# Patient Record
Sex: Female | Born: 1935 | Race: White | Hispanic: No | Smoking: Never smoker
Health system: Southern US, Community
[De-identification: ages and names within clinical notes are randomized; demographics above are authoritative.]

## PROBLEM LIST (undated history)

## (undated) DIAGNOSIS — F0390 Unspecified dementia without behavioral disturbance: Secondary | ICD-10-CM

## (undated) DIAGNOSIS — I4891 Unspecified atrial fibrillation: Secondary | ICD-10-CM

## (undated) DIAGNOSIS — K219 Gastro-esophageal reflux disease without esophagitis: Secondary | ICD-10-CM

## (undated) DIAGNOSIS — I2510 Atherosclerotic heart disease of native coronary artery without angina pectoris: Secondary | ICD-10-CM

## (undated) DIAGNOSIS — E119 Type 2 diabetes mellitus without complications: Secondary | ICD-10-CM

## (undated) DIAGNOSIS — I509 Heart failure, unspecified: Secondary | ICD-10-CM

## (undated) DIAGNOSIS — I10 Essential (primary) hypertension: Secondary | ICD-10-CM

---

## 1999-06-26 ENCOUNTER — Ambulatory Visit (HOSPITAL_COMMUNITY): Admission: RE | Admit: 1999-06-26 | Discharge: 1999-06-26 | Payer: Self-pay | Admitting: Cardiology

## 1999-10-09 ENCOUNTER — Ambulatory Visit (HOSPITAL_COMMUNITY): Admission: RE | Admit: 1999-10-09 | Discharge: 1999-10-09 | Payer: Self-pay | Admitting: Obstetrics & Gynecology

## 2000-11-19 ENCOUNTER — Other Ambulatory Visit: Admission: RE | Admit: 2000-11-19 | Discharge: 2000-11-19 | Payer: Self-pay | Admitting: Internal Medicine

## 2001-02-24 ENCOUNTER — Encounter: Admission: RE | Admit: 2001-02-24 | Discharge: 2001-02-24 | Payer: Self-pay | Admitting: Internal Medicine

## 2001-02-24 ENCOUNTER — Encounter: Payer: Self-pay | Admitting: Internal Medicine

## 2001-09-18 ENCOUNTER — Emergency Department (HOSPITAL_COMMUNITY): Admission: EM | Admit: 2001-09-18 | Discharge: 2001-09-18 | Payer: Self-pay

## 2001-10-02 ENCOUNTER — Inpatient Hospital Stay (HOSPITAL_COMMUNITY): Admission: EM | Admit: 2001-10-02 | Discharge: 2001-10-13 | Payer: Self-pay | Admitting: Emergency Medicine

## 2001-10-02 ENCOUNTER — Encounter: Payer: Self-pay | Admitting: Emergency Medicine

## 2001-10-04 ENCOUNTER — Encounter (HOSPITAL_BASED_OUTPATIENT_CLINIC_OR_DEPARTMENT_OTHER): Payer: Self-pay | Admitting: Internal Medicine

## 2001-10-13 ENCOUNTER — Inpatient Hospital Stay: Admission: RE | Admit: 2001-10-13 | Discharge: 2001-11-02 | Payer: Self-pay | Admitting: Internal Medicine

## 2001-10-16 ENCOUNTER — Ambulatory Visit (HOSPITAL_COMMUNITY): Admission: RE | Admit: 2001-10-16 | Discharge: 2001-10-16 | Payer: Self-pay | Admitting: Internal Medicine

## 2001-10-16 ENCOUNTER — Encounter (HOSPITAL_BASED_OUTPATIENT_CLINIC_OR_DEPARTMENT_OTHER): Payer: Self-pay | Admitting: Internal Medicine

## 2001-10-20 ENCOUNTER — Encounter (HOSPITAL_BASED_OUTPATIENT_CLINIC_OR_DEPARTMENT_OTHER): Payer: Self-pay | Admitting: Internal Medicine

## 2001-10-28 ENCOUNTER — Encounter (HOSPITAL_BASED_OUTPATIENT_CLINIC_OR_DEPARTMENT_OTHER): Payer: Self-pay | Admitting: Internal Medicine

## 2002-01-04 ENCOUNTER — Encounter (INDEPENDENT_AMBULATORY_CARE_PROVIDER_SITE_OTHER): Payer: Self-pay | Admitting: Specialist

## 2002-01-04 ENCOUNTER — Ambulatory Visit (HOSPITAL_COMMUNITY): Admission: RE | Admit: 2002-01-04 | Discharge: 2002-01-05 | Payer: Self-pay | Admitting: General Surgery

## 2003-08-28 ENCOUNTER — Inpatient Hospital Stay (HOSPITAL_COMMUNITY): Admission: AD | Admit: 2003-08-28 | Discharge: 2003-09-12 | Payer: Self-pay | Admitting: Emergency Medicine

## 2003-08-28 ENCOUNTER — Encounter: Payer: Self-pay | Admitting: Emergency Medicine

## 2003-08-31 ENCOUNTER — Encounter: Payer: Self-pay | Admitting: Internal Medicine

## 2003-09-04 ENCOUNTER — Encounter: Payer: Self-pay | Admitting: *Deleted

## 2003-09-05 ENCOUNTER — Encounter: Payer: Self-pay | Admitting: Internal Medicine

## 2003-09-06 ENCOUNTER — Encounter (INDEPENDENT_AMBULATORY_CARE_PROVIDER_SITE_OTHER): Payer: Self-pay | Admitting: Cardiology

## 2003-09-12 ENCOUNTER — Inpatient Hospital Stay: Admission: RE | Admit: 2003-09-12 | Discharge: 2003-09-23 | Payer: Self-pay | Admitting: Internal Medicine

## 2004-07-25 ENCOUNTER — Emergency Department (HOSPITAL_COMMUNITY): Admission: EM | Admit: 2004-07-25 | Discharge: 2004-07-25 | Payer: Self-pay | Admitting: Emergency Medicine

## 2006-04-03 ENCOUNTER — Inpatient Hospital Stay (HOSPITAL_COMMUNITY): Admission: EM | Admit: 2006-04-03 | Discharge: 2006-04-06 | Payer: Self-pay | Admitting: Emergency Medicine

## 2006-04-04 ENCOUNTER — Encounter: Payer: Self-pay | Admitting: Vascular Surgery

## 2006-12-18 ENCOUNTER — Ambulatory Visit: Payer: Self-pay | Admitting: Internal Medicine

## 2006-12-23 ENCOUNTER — Ambulatory Visit: Payer: Self-pay | Admitting: Cardiology

## 2010-12-07 ENCOUNTER — Encounter
Admission: RE | Admit: 2010-12-07 | Discharge: 2010-12-07 | Payer: Self-pay | Source: Home / Self Care | Attending: Nephrology | Admitting: Nephrology

## 2011-01-06 ENCOUNTER — Encounter: Payer: Self-pay | Admitting: Family Medicine

## 2011-05-03 NOTE — Assessment & Plan Note (Signed)
HEALTHCARE                         GASTROENTEROLOGY OFFICE NOTE   CASS, EDINGER                       MRN:          [id number]  DATE:12/18/2006                            DOB:          1922-04-01    PRIMARY CARE PHYSICIAN:  Maxwell Caul, M.D.   REASON FOR CONSULTATION:  Abdominal pain.   ASSESSMENT:  An 77 year old white women has had a 34-month history of  periumbilical left-sided abdominal pain.  It is poorly characterized.  It may be helped somewhat by some Bentyl.  It is not affected by meals  or position and there is no change in bowel habits or other upper GI  symptomatology.  She is tender in the left upper and left lower  quadrants, without rebound or guarding.  I am only able to examine her  in the sitting position as she is unable to get onto the exam table, due  to lower extremity weakness.   PLAN:  1. BMET, TSH (she is gaining weight and has peripheral edema).  2. CT of abdomen and pelvis with IV and oral contrast.  3. Phone call to the patient after these results are in, further plans      pending that.  Depending upon her clinical course if the CT is      unrevealing and she feels better we might just monitor things.   HISTORY:  An 77 year old white women with problems as described above.  For the past 2 months she has had this problem.  She does not have  chronic abdominal complaints.  She says she is actually gaining some  weight, though she is eating less.  She feels like abdominal growth may  be a bit more, I think.  Denies other GI symptoms, she moves her bowels  twice a day, this is normal.  There is no description of bleeding,  dysphagia, heartburn, indigestion or regurgitation or nausea and  vomiting.   MEDICATIONS:  1. AcipHex 20 mg daily.  2. Aspirin 325 mg daily.  3. Diovan 80 mg daily.  4. Flonase daily.  5. Lasix 80 mg daily.  6. Glucotrol 5 mg daily.  7. Metoprolol 25 mg daily.  8. Tylenol 500 mg  daily.  9. Potassium chloride 20 mg twice daily.  10.Desyrel 50 mg b.i.d.  11.Ambien CR 12.5 mg at bed time.  12.Dermatop ointment p.r.n.  13.Nizoral 2% creme p.r.n.  14.Sina p.r.n.  15.Darvocet p.r.n.   Drug allergies, none known.   She is also on Bentyl 20 mg t.i.d.   PAST MEDICAL HISTORY:  Gated stability, hyperparathyroidism, mild  anemia, lower sacral fracture, depression, questionable dementia,  history of urinary tract infection, cholecystectomy, hiatal hernia  repair, hysterectomy, appendectomy, allergies, sleep apnea,  hypertension, obesity and over active bladder.   FAMILY HISTORY:  Non contributory.   SOCIAL HISTORY:  She is widowed, she is living at [employer].  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  She has 2 sons.  No alcohol, tobacco  or drugs.   REVIEW OF SYSTEMS:  She uses a walker due to gate instability and lower  extremity weakness.  She has hearing difficulty.  She has increasing  lower extremity edema which is a chronic recurrent process.  She has  chronic back pain.   PHYSICAL EXAMINATION:  GENERAL:  Obese, pleasant overweight elderly  women.  VITAL SIGNS:  Height 52, weight 210 pounds blood pressure 92/64, pulse  58.  She is unable to get onto the exam table due lower extremity  weakness.  HEENT:  She has decreased hearing and a loud voice is heard by her.  Eyes:  Anicteric.  ENT:  Normal nose, mouth.  NECK:  Supple no thyromegaly or mass.  CHEST:  Clear.  HEART:  S1, S2, I hear no rubs, murmurs or gallops.  ABDOMEN:  Obese, tender in left lower quadrant, left upper quadrant area  examined in a sitting position and standing.  I do not see any obvious  hernia.  She has minimal guarding I suppose, in that area without any  rebound.  EXTREMITIES:  She has 2+ lower extremity edema bilaterally.  LYMPHATIC:  No neck or supraclavicular nodes.  PSYCH:  She is alert and orientated x3.  SKIN:  Is warm and dry and aged.   I have reviewed office  notes sent by Dr. Hyacinth Meeker.   I appreciate the opportunity to care for this patient.     Iva Boop, MD,FACG  Electronically Signed    CEG/MedQ  DD: 12/18/2006  DT: 12/18/2006  Job #: [id number]   cc:   Bertram Millard. Hyacinth Meeker, M.D.

## 2011-05-03 NOTE — Discharge Summary (Signed)
[place]  Patient:    Isabella Mccoy, ARRAMBIDE Visit Number: [id number] MRN: [id number]          Service Type: ECR Location: SACU 4502 01 Attending Physician:  Terald Sleeper Dictated by:   Lorin Picket. Claiborne Billings, N.P. Admit Date:  10/13/2001 Disc. Date: 11/02/01   CC:         Mark C. Vernie Ammons, M.D. of urology  Ollen Gross. Vernell Morgans, M.D. of surgery  Alfonse Alpers. Dagoberto Ligas, M.D. of endocrinology   Discharge Summary  DATE OF BIRTH:  05-Feb-1922  CHIEF COMPLAINT:  Deconditioning requiring physical and occupational therapy.  HISTORY OF PRESENT ILLNESS:  This elderly female patient of Dr. Gaspar Skeeters was followed at [facility] office.  She was actually admitted to hospital October 02, 2001 for fall, spending a great deal of time on the floor and unable to get back up.  Noted decline in her renal function and elevated serum CK - both thought to be due to her prolonged time down post fall.  Over the course of hospitalization she was noted to have urinary retention, stabilized with a Foley catheter.  She was noted to have an elevated serum calcium and an elevated parathyroid function.  Etiology of this is unclear. She was acutely delirious but improved over the course of initial hospitalization.  It is felt that the patient has some baseline dementia.  A mini mental exam was administered and the patient scored 15/30.  She was noted to have a urinary tract infection and this resolved with antibiotic therapy. On October 14, 2001 patient was discharged from her regular medical bed to the [facility].  It was felt that her gait was not sufficiently stable to allow her to return home.  MEDICAL HISTORY:  1. Left mild CVA.  2. Hypertension.  3. Urinary frequency.  4. Hiatal hernia.  5. Osteoarthritis.  6. Urinary retention, stabilized with Foley catheter.  7. Hypercalcemia and hyperparathyroidism - etiology unclear.  8. Acute  delirium.  9. Leukocytosis - etiology unclear. 10. Urinary tract infection, resolved with antibiotic therapy. 11. Weakness and gait instability requiring physical and occupational therapy     at [facility] October 2002.  MEDICATIONS AT TIME OF ADMISSION:  See admission History and Physical Exam.  SOCIAL HISTORY:  Patient was living alone.  She has two sons who are joint power-of-attorney.  REVIEW OF SYSTEMS:  HEENT:  Denied headache.  MOTOR:  Denies focal weakness but does complain of numbness off and on in the left hand.  Family has noted a gait decline over several months.  Patient has a walker but was not utilizing it at home.  CARDIOVASCULAR:  Denied chest pain or palpitation.  LUNGS: Denies shortness of breath or cough.  ABDOMEN:  Denies nausea and vomiting. GENITOURINARY:  Denies any symptoms of UTI.  There is a history of recent urinary retention, stabilized with Foley catheter.  PHYSICAL EXAMINATION:  CURRENT VITAL SIGNS:  Temperature 98.2, pulse 82, respirations 20, blood pressure 191/86.  O2 saturation 95% on room air.  GENERAL:  Alert and responsive elderly female in no acute distress.  HEENT:  Fundi revealed grade 2 hypertensive changes.  Ear canals clear and hearing grossly normal.  Nose reveals nares patent without masses or discharge noted.  Oral mucosa pink and moist.  LUNGS:  Breath sounds are clear and equal without distress or cough.  CARDIOVASCULAR:  Heart sounds normal with rate and rhythm regular.  No murmur, S3, S4 noted.  BREAST:  Deferred as noncontributory.  ABDOMEN:  Soft and positive bowel sounds in all four quadrants.  No pain or masses with palpation.  No organomegaly noted.  EXTREMITIES:  No edema seen.  There is osteoarthritis and crepitus at the knee joints bilaterally.  GENITOURINARY:  No bladder pain or CVA tenderness.  Foley catheter in place draining clear yellow urine.  NEUROLOGIC:  Cranial nerves 2-12 grossly intact.   Extraocular movements appear intact.  No increase in tone.  No cogwheeling noted.  There is decreased strength bilaterally in the upper extremities, estimated 4/5.  There is left pronator drift.  Reflexes found Babinski responses extensor bilaterally. Reflexes, deep tendon, 3+ at the knees bilaterally.  Left brachioradialis and left biceps reflexes are brisker than on the right.  Positive left Hoffmanns reflex.  HOSPITAL COURSE:  Again, patient was admitted to the rehab unit October 14, 2001 for strengthening and gait training.  Mini mental exam was administered with the patient scoring 15/30.  Clark test was administered and the patient was unable to localize a time.  Serum calcium levels noted elevated throughout the initial portion of hospitalization.  Also noted elevated serum calcium at 11.8 at admission to the rehabilitation unit.  Patient was started on Miacalcin, thinking that perhaps the elevated serum calcium was responsible for her mental status changes.  PTH intact noted elevated at 99.  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  Serum calcium, with the use of Miacalcin, was reduced to nearly normal levels of 10.8 by October 31, 2001.  This made no obvious change in the patients mental function, with repeat mini mental examination score still 15/30.  There was mild hypokalemia noted but this resolved with replacement.  A consult was obtained with Dr. Corrin Parker. He came October 20, 2001.  Felt the ultimate question was whether the patient should have a parathyroid surgery or not.  Felt it was reasonable to lower calcium levels and follow the patients mentation.  Felt that irregardless of the effectiveness in lowering patients serum calcium in regards to her mentation that the hyperparathyroidism would be progressive over a chronic  period of time and therefore ultimately may need surgery  irrespective of her mental status.  A consult obtained with Dr. Chevis Pretty on October 26, 2001. Patient had a sestamibi scan that appeared to localize possible parathyroid adenoma.  Did not feel that elevated calcium levels, at the degree seen over hospitalization, were a major factor in current mental status.  Felt that the patient may be amenable to a minimally invasive parathyroidectomy.  Felt that this surgery could be arranged on an elective basis.  Per therapy, following the patients progress since her stay here in the rehabilitation unit, it was felt that the patient would require discharge to an SNF facility for continued physical and occupational therapy.  The plan is to transfer the patient to [facility] for therapy in that setting.  Calcitonin was discontinued and will require a follow-up calcium post discharge.  Will need a follow-up with Dr. Vernie Ammons of urology to reevaluate urinary retention and the need for the Foley catheter.  Will need follow-up with Dr. Carolynne Edouard of surgery in one to two weeks post discharge to reevaluated possible parathyroid surgery.  At discharge the patient was requiring minimal assistance to ambulate approximately 30 feet.  Will need nutritional follow-up post discharge to reevaluate patients oral intake and to follow  weights and intakes for any indication of failure-to-thrive.  At discharge the patient is medically stable though frail, with multiple medical problems as above.  It is hoped at some point we will be able to discontinue the Foley catheter allowing the patient to transfer to an assisted living level of care once she is stable from a physical and occupational therapy standpoint.  DISCHARGE LABORATORY DATA:  Serum albumin done November 02, 2001 was low at 3.2.  CBC done November 02, 2001 found WBC 9.7, RBC low 3.71, hemoglobin low 12.1, hematocrit low 34.7, platelets 250.  Metabolic panel November 02, 2001 found sodium 138,  potassium 4.2, chloride 111, CO2 21, glucose high 156, BUN 18, creatinine 1.1.  Calcium was high at 11.0.  Stool for occult blood was negative.  PTT 28 and PT 14.2 with INR 1.1 done October 27, 2001.  Urine culture done October 22, 2001 was negative.  Hepatic function panel done October 21, 2001 was essentially normal.  Serum phosphorus was low at 1.7.  Ultrasound of the thyroid noted an unremarkable thyroid ultrasound, other than a small midline nodule within the isthmus, done October 16, 2001.  Abdominal x-ray noted no significant obstruction or perforation of the bowel. Minimal increase in large and small bowel gas.  Vague 1.5 x 2 cm density right upper abdomen, probably due to loops of bowel, but the possibility of minimally calcified gallstones could not be completely ruled out.  A parathyroid scan done October 21, 2001 indicated abnormal focus of increased activity within the left neck consistent with parathyroid adenoma.  X-rays of the sacrum and coccyx noted contour abnormality in the lower sacrum. Unclear if this represents an acute or chronic fracture.  Correlate with point of tenderness.  DISCHARGE MEDICATIONS:  1. Plavix 75 mg once daily.  2. Inderal 20 mg twice daily.  3. Norvasc 5 mg daily.  4. Avapro 300 mg daily.  5. Multivitamin daily.  6. Senokot S one tablet daily at bedtime.  7. Protonix 40 mg once daily.  8. Zoloft 25 mg daily.  9. Dulcolax 10 mg suppository once daily at bedtime as needed for     constipation. 10. Tylenol 650 mg q.4h. as needed for mild pain or fever. 11. Ensure chocolate supplement three times daily between meals at 10 a.m.,     2 p.m., and bedtime.  DISCHARGE DIAGNOSES:  1. Gait instability requiring ongoing physical and occupational therapy.  2. Hyperparathyroidism, probable adenoma per scan.  Follow up post discharge     - Dr. Carolynne Edouard.  3. Mild anemia, stable.  4. Urinary retention, discharged with Foley catheter.  Follow up with      Dr. Vernie Ammons of urology one to two weeks post discharge.  5. Hypercalcemia secondary to hyperparathyroidism.  6. Urinary tract infection, resolved.  7. Question lower sacral fracture, chronic versus acute.  8. Hypokalemia, resolved.  9. Depression, stable. 10. Probable moderate dementia per baseline.  CONSULTS OVER THE COURSE OF HOSPITALIZATION:  1. Dr. Ollen Gross. Toth III of surgery.  2. Dr. Alfonse Alpers. Gegick of endocrinology.  3. Dr. Loraine Leriche C. Ottelin of urology.  PROCEDURES OVER THE COURSE OF HOSPITALIZATION:  1. Parathyroid scan, October 20, 2001 - as above.  2. Ultrasound of the thyroid/neck, October 16, 2001 - results as above.  3. X-ray of the sacrum and coccyx with results as above, October 28, 2001.  SPECIAL DISCHARGE INSTRUCTIONS:  1. Patient will require a follow up urology appointment with Dr. Vernie Ammons.     Please  call his office for appointment and arrange transportation.     Question is can Foley catheter be discontinued.  2. Patient will need a follow up with Dr. Carolynne Edouard of surgery to review     possibility of parathyroid surgery, one to two weeks post discharge.     Please call his office to arrange appointment and transportation.  3. Patient will need a follow-up serum calcium on November 05, 2001.  4. Foley catheter to straight drain.  Maintain per facility protocol.  5. Physical and occupational therapy evaluate and treat as appropriate.  6. Should have follow-up CBC and basic metabolic panel approximately two     weeks post discharge.  Metabolic panel should include serum calcium.  7. Discharge diet is regular.  CONDITION AT TIME OF DISCHARGE:  Stable/improved, though medically frail with multiple medical problems as above.  Cannot disclude the need for further hospitalizations.  Discharge process greater than 30 minutes - 12:15 p.m. through 1:10 p.m. Dictated by:   Lorin Picket. Claiborne Billings, N.P. Attending Physician:  Terald Sleeper DD:  11/02/01 TD:   11/02/01 Job: [id number]

## 2011-05-03 NOTE — Discharge Summary (Signed)
NAMEMarland Mccoy  LICHELLE, VIETS NO.:  [id number]   MEDICAL RECORD NO.:  [id number]          PATIENT TYPE:  INP   LOCATION:  4740                         FACILITY:  [facility]   PHYSICIAN:  Lonia Blood, M.D.      DATE OF BIRTH:  Oct 15, 1922   DATE OF ADMISSION:  04/03/2006  DATE OF DISCHARGE:  04/06/2006                                 DISCHARGE SUMMARY   PRIMARY CARE PHYSICIAN:  Maxwell Caul, M.D., [facility].   DISCHARGE DIAGNOSES:  1.  Acute shortness of breath.  2.  Hypoxemia.  3.  Lower extremity edema, now resolved.  4.  Acute on chronic renal failure.  5.  Paroxysmal atrial fibrillation.  6.  Diabetes type 2.  7.  Obesity.  8.  History of cerebrovascular accident.  9.  Hiatal hernia.   DISCHARGE MEDICATIONS:  1.  Aspirin 325 mg daily.  2.  Aciphex 20 mg daily.  3.  Senokot S one tablet q.h.s.  4.  Multivitamin one tablet daily.  5.  Lasix 80 mg daily.  6.  Nasonex nasal inhaler one each nostril daily.  7.  Diovan 80 mg daily.  8.  Toprol XL 25 mg daily.  9.  Ciprofloxacin 1250 mg b.i.d. for three more days.  10. Glipizide 5 mg daily.  11. Trazodone 50 mg p.o. b.i.d.  12. Potassium 20 mEq b.i.d.  13. The patient's Norvasc is currently on hold and may be resumed by her      primary care physician as an outpatient.   DISPOSITION:  The patient is currently stable, no shortness of breath.  She  insists on leaving the hospital and also refused to have a V/Q scan done to  further assess the initial cause of her shortness of breath.  I do not  believe strongly that she has a PE.  As such, I did not press her further.  Besides, she is not a good candidate for anticoagulation secondary to  multiple falls.   PROCEDURES PERFORMED:  1.  Chest x-ray performed on April 03, 2006, a two-view x-ray, shows      cardiomegaly with no failure, with right basilar atelectasis.  2.  Another chest x-ray also on the same day showed cardiomegaly with      vascular  congestion but no edema.   CONSULTATIONS:  None.   BRIEF HISTORY AND PHYSICAL:  Please refer to dictated history and physical  by Lonia Blood, M.D.  In short, however, the patient is an 77 year old female  that presented with shortness of breath.  She had a history of CHF by  diastolic dysfunction, depression, diabetes and hypertension.  She also had  some bilateral lower extremity edema at the time of admission.  She has  baseline history of chronic atrial fibrillation, which is paroxysmal in  nature.  While in the ED, the patient was found to be somewhat hypotensive  despite her findings.  Her blood pressure at the time was as low as 90;  however, it rose to 110/60.  The patient was subsequently admitted for rule-  out MI as  well as PE.   HOSPITAL COURSE:  Problem 1.  SHORTNESS OF BREATH:  The patient was admitted.  The dose of her  diuretics was slightly decreased, placed on oxygen.  While in the hospital  she had serial cardiac enzymes performed x3 that showed no evidence of acute  MI.  Also had lower extremity Dopplers performed to rule out DVT, which was  negative.  A V/Q scan was also ordered due to her low serum creatinine, but  the patient refused to allow a V/Q scan to be performed.  Her clinical  course improved, and she has been back to her baseline for two days prior to  today.   Problem 2.  ACUTE RENAL FAILURE:  The patient seemed to have been at her  baseline with creatinine ranging between 1.3 and 1.4.  We maintained her on  her current diuretic regimen after reducing the Lasix dose to 80 mg a day.   Problem 3.  ATRIAL FIBRILLATION:  The patient has been in and out of atrial  fibrillation, but her rate has been controlled for the most part.   Problem 4.  LOWER EXTREMITY EDEMA:  That has gradually disappeared, probably  due to the increase in her Lasix dose.   Problem 5.  DIABETES:  Her blood sugar has been fine here in the hospital.  As such, no further adjustments  were made to her medications.   Problem 6.  OBESITY:  The patient has been counseled accordingly.   Problem 7.  CONSTIPATION:  The patient has had four bowel movements since  admission.  We have given her some laxatives that  have not been as useful.  I am giving her magnesium citrate today and once she has had a good bowel  movement, she will be ready to go home.   DISCHARGE LABORATORY DATA:  Labs include white count 10.3, hemoglobin 13,  platelets 190.  Sodium is 144, potassium 3.5, chloride 109, CO2 26, glucose  107, BUN 20, creatinine 1.4.      Lonia Blood, M.D.  Electronically Signed     LG/MEDQ  D:  04/06/2006  T:  04/08/2006  Job:  [id number]

## 2011-05-03 NOTE — Discharge Summary (Signed)
[place]. [facility]  Patient:    Isabella Mccoy, Isabella Mccoy Visit Number: [id number] MRN: [id number]          Service Type: ECR Location: SACU 4502 01 Attending Physician:  Terald Sleeper Dictated by:   Lorin Picket. Claiborne Billings, N.P. Admit Date:  10/13/2001 Disc. Date: 10/14/01   CC:         Terald Sleeper, M.D.  Veverly Fells. Vernie Ammons, M.D.  Kelli Hope, M.D.   Discharge Summary  DATE OF BIRTH:  1922-01-05  CHIEF COMPLAINT:  Fall, spending a long time on the floor and unable to get back up.  HISTORY OF PRESENT ILLNESS:  This 77 year old patient is followed by Dr. Murray Hodgkins at [facility].  She was admitted through the emergency room after falling at home.  She was unable to get up after falling. A clear history of this fall was unable to be obtained.  She states that she was on her way to the telephone.  Apparently, she had three falls over the past few months.  Cause of these were uncertain.  There was no clear history of chest pain, palpation, loss of consciousness, focal or neurologic signs. Son states that her gait has deteriorated over the past month.  She has a walker, but really does not use this.  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  She has, however, made a good functional recovery but complains of numbness in the left hand currently.  She was also in the ER with left hand numbness some weeks ago, but apparently, the CT scan done at that time was negative.  PAST MEDICAL HISTORY:  Though limited as old records were unavailable, but includes: 1. Left mild CVA. 2. Hypertension. 3. Urinary frequency. 4. Hiatal hernia. 5. Osteoarthritis.  MEDICATIONS ON ADMISSION: 1. Diovan/HCTZ 80/12.5 mg once daily. 2. Celebrex 200 mg daily. 3. Plavix 75 mg daily. 4. Metoclopramide 10 mg daily at bedtime. 5. Sonata. 6. Propranolol 20 mg twice daily. 7. Detrol 2 mg twice daily. 8. Albuterol and  Serevent metered dose inhalers as needed.  SOCIAL HISTORY:  The patient lives alone.  Two sons who have joint power of attorney.  One of the sons was with his mother at the time of admission.  The family had already initiated inquiries regarding assisted living.  REVIEW OF SYSTEMS:  HEENT:  Denies headache.  MOTOR:  Denies focal weakness. Does complain of numbness off and on in the left hand.  The family has noted a gait decline over the past several months.  Apparently, the patient owns a walker but does not use it.  CARDIOVASCULAR:  Denies chest pain or palpation. ABDOMEN:  Denies nausea or vomiting.  URINARY/GENITAL:  Denies symptomology to suggest UTI.  PHYSICAL EXAMINATION:  VITAL SIGNS:  Only listed blood pressure per admission history and physical exam, 178/50.  GENERAL:  Alert and responsive.  Elderly female in no acute distress.  The patient was utilizing a warming blanket in the emergency room.  HEENT:  Fundi revealed grade 2 hypertensive changes.  Ears: Canals clear. Hearing grossly normal.  Nose:  There is pain without masses or discharge noted.  Oral mucosa is pink and moist.  RESPIRATORY:  Lungs clear and equal without stress or cough.  CARDIOVASCULAR:  Heart sounds normal, no murmur.  S3, S4, no bruits noted.  BREAST:  Deferred.  ABDOMEN:  Soft and positive bowel sounds in all four quadrants.  No  pain or masses with palpation.  No organomegaly noted.  EXTREMITIES:  No edema seen.  There is osteoarthritis and crepitus in the knees bilaterally.  URINARY/GENITAL:  No bladder pain.  NEUROLOGIC:  Cranial nerves II-XII grossly intact.  Extraocular movements appear intact.  No increased tone or cogwheeling noted.  There is decreased strength bilaterally in the upper extremities 4/5.  There is left pronator drift.  Reflexes found Babinski responses extensory bilaterally.  Reflexes 3+ and brisk at the knees.  Left brachial radialis and left bicep reflexes are brisker  than the right.  Positive left Hoffmans reflex.  HOSPITAL COURSE:  The patient was admitted for further evaluation and stabilization.  Admission impressions of gait ataxia and falls.  Parkinsonism either idiopathic disease, vascular parkinsonism, or Reglan-induced parkinsonism.  Possible urinary tract infection and hypertension.  Cardiac enzymes noted (see labs below).  Doubt an acute MI, but rather rapid myelosis secondary to fall with prolonged time down.  Did note mild ST changes per the EKG, thus patient was placed on telemetry.  Admission laboratory data ______ 21.0, total ______ 22.0.  CBC October 02, 2001 ______ 39.  Differential done October 02, 2001 was essentially unremarkable.  Metabolic panel October 02, 2001 found sodium 144, potassium low 2.9, chloride 110, CO2 24, BUN 16 and creatinine 1.1.  Calcium noted elevated 11.9.  AST elevated 38 with other liver function tests unremarkable.  Initial cardiac enzymes, CK elevated 434, MB high 11.4, relative index 2.6 and troponin elevated to 0.10.  Followup enzymes CK high 302, MB high 8.0.  Relative index high 2.6 and troponin elevated 0.08.  Followup enzymes, CK high 205, MB high 6.5, relative index high 3.2 and troponin 0.06.  Final enzymes, October 05, 2001, CK of 142, MB 2.9, relative index 2.0 and troponin 0.02.  TSH normal 4.621.  Urinalysis suspicious for UTI with positive nitrite, 2150 wbc, 0-2 RBC and many bacteria. Urine culture obtained at the time of admission.  A CT scan of the brain was done October 02, 2001.  This indicated no evidence of acute intracranial process.  No change since recent prior CT scan.  Patient was initiated on her previous medications.  Also started empirically on Tequin antibiotic therapy for suspected urinary tract infection.  Diet, regular with no added salt.  IV  hydration with D5 normal saline and potassium supplementation 125 cc per hour. ______  Renal function remained ______ colony counts  ______ therapy. ______ and antibiotic therapy was discontinued and a course of Septra DS started twice daily for a total of 10 days.  ______ was noted quite confused over the course of hospitalization, unclear of exact patient baseline here, however, suspected an acute delirium.  An MRI/MRA of the brain was done October 04, 2001 to further follow.  MRI portion noted chronic ischemic changes with no acute abnormality. MRA portion was suboptimal study with no definite intracranial occlusion.  ______ problems per telemetry and patient was moved from telemetry unit.  Was discontinued for parkinsonism at admission.  Symptoms have gradually improved over the course of hospitalization and parkinsonism felt likely secondary to the Reglan.  The patient developed what appeared to be urinary retention.  A Foley catheter was placed for management.  A urology consult was obtained with Dr. Vernie Ammons October 08, 2001.  Noted patient was currently on Urecholine. Felt this could be increasing sense of urgency and should be stopped.  Also noted patient on Detrol, which would decrease urgency and possibly cause postvoid residual and also felt  this should be stopped.  Per a lengthy discussion with the patients family, it was felt best to manage the patients problem short-term with a Foley catheter placement.  Felt the patient should follow up with him in about four weeks post discharge.  Also seen in consult by Dr. Kelli Hope of neurology regarding gait disorder and falls.  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  parkinsonian component probably vascular in nature.  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  Did agree with discontinuation of Reglan.  If this did not result in significant improvement, would consider trial of Sinemet 25/100 mg tablets, 1/2 tablet 3 times daily with  meals.  Also, strongly consider antidepressant, possibly in combination with stimulant Ritalin.  Did suggest the MRI, which was reported as above.  The patients mental status did improve somewhat over the course of hospitalization, however, admitting mental exam was administered and the patient scored 15/30.  Again, I am unsure of exact patient baseline, but suspect an underlying dementia here as well.  Weight currently is 180 with ideal body weight of 110 plus or minus 10%.  Thus patient 148 of ideal body weight.  Nutritional needs estimated 1600 to 1700 kcal and 80-90 g of protein daily.  Suggested liberalization of diet from regular no-added salt, to regular in an attempt to improve oral intake which was done.  The patient did have some low-grade leukocytosis, unclear of the etiology here and this was not aggressively investigated on the course of hospitalization.  This will need to be followed up as an outpatient.  Finally, the patient had some elevated serum calciums.  A serum PTH was done October 06, 2001 with PTH intact, level elevated 141.  Calcium for PTH noted 10.3 at that time.  SED rate was also checked and this was normal at 8.  It is felt that some type of parathyroid etiology is responsible for hyperkalemia.  This was simply monitored over the course of hospitalization at this point.  Ethical discussions were conducted with the patients family regarding placement.  It was not felt that the patient would be able to return to independent living. Ultimately, a decision was made to seek assisted living placement that would accommodate a Foley catheter.  It was felt that the patients gait was currently too unstable for even assisted living level, thus placement was sought at subacute care unit.  Will need follow up blood work to monitor serum calcium.  Will need close supervision of oral intake to assure adequacy by nutritionalist.  Will need ongoing physical and occupational  therapy with ongoing goal to return to assisted living level.  Antibiotic therapy will need to be completed for noted urinary tract infection.  At discharge, patient stable/improved, thus felt medically fairly mobile medical problems as above.  DISCHARGE LABORATORY DATA:  Comprehensive metabolic panel done October 11, 2001 found sodium 140, potassium low 3.3, chloride 114, CO2 19, glucose high 182, BUN 11, creatinine 1.3.  Calcium was high at 1.9.  Albumin was low at 3.3.  Liver indices unremarkable.  Follow-up blood gas October 11, 2001 on room air, pH high 7.449, pCO2 low 29.1, pO2 98.9, bicarb 22.4 and total CO2 20.8.  CBC October 11, 2001 found WBC elevated, though improved 1.9, hemoglobin 13.8, hematocrit 39.5, platelets 245.  Sed rate done October 04, 2001 normal at 8.  Cardiac enzymes as indicated above.  Serum TSH normal 4.621.  Serum  PTH elevated 141.  Urine culture greater than 100,000 colony, staph coagulase negative, sensitive to current antibiotic therapy with Septra DS.  Radiology as reported above.  DISCHARGE MEDICATIONS:  1. Plavix 75 mg once daily.  2. Propranolol 20 mg twice daily.  3. Norvasc 5 mg once daily.  4. Avapro 300 mg daily.  5. Septra DS one tablet twice daily through October 19, 2001 and then     discontinue.  6. Multivitamin once daily.  7. Albuterol metered dose inhaler 2 puffs every 4 hours as needed.  8. Tylenol 650 mg every 4 hours as needed for mild pain or fever.  9. Laxative of choice once daily. 10. Haldol 1 mg every 6 hours as needed for severe agitation.  DISCHARGE DIAGNOSES:  1. Urinary retention, stabilized with Foley catheter.  Followup urology in     approximately 4 weeks.  2. Hypercalcemia and hyperparathyroidism, exact etiology unclear at this     point.  3. Acute delirium, resolved/improved but note baseline dementia likely.  4. Leukocytosis unclear etiology.  Seems to self improving.  5. Weakness and gait instability requiring  physical and occupational therapy     rehabilitation postdischarge.  6. Urinary tract infection, improving on current antibiotic therapy.   CONSULTS OVER THE COURSE OF HOSPITALIZATION:  1. Dr. Vernie Ammons, neurology.  2. Dr. Thad Ranger, neurology.  PROCEDURES OVER THE COURSE OF HOSPITALIZATION:  1. MRI of the brain, October 04, 2001, results as above.  2. CT scan of the brain, October 02, 2001, results as above.  ADDITIONAL DISCHARGE INSTRUCTIONS:  1. Will need a followup with Dr. Vernie Ammons in approximately four weeks     postdischarge.  2. Review urinary retention and Foley catheter use.  3. Diet regular at discharge.  4. Will need followup of serum calcium postdischarge.  5. Will need nutritional consult to insure adequacy of diet and fluid intake     postdischarge.  6. Will need mental function review with repeat mini mental status exam at     some point in the near future as well.  CONDITION AT THE TIME OF DISCHARGE:  Stable/improved, though medically fair with multiple medical problems as above.  DISCHARGE PROCESS:  Greater than 30 minutes, 10:30 a.m. through 11:15 a.m. Dictated by:   Lorin Picket. Claiborne Billings, N.P. Attending Physician:  Terald Sleeper DD:  10/15/01 TD:  10/15/01 Job: [id number]

## 2011-05-03 NOTE — Consult Note (Signed)
[place]. [facility]  Patient:    Isabella Mccoy, Isabella Mccoy Visit Number: [id number] MRN: [id number]          Service Type: ECR Location: SACU 4502 01 Attending Physician:  Terald Sleeper Dictated by:   Alfonse Alpers. Dagoberto Ligas, M.D. Admit Date:  10/13/2001                            Consultation Report  HISTORY OF PRESENT ILLNESS:  This is a 77 year old woman who was admitted to the hospital with a history of falling and weakness.  She apparently has been in the hospital for some time with weakness and has had several evaluations, including neurological evaluation, showing that she has probably had a left cerebrovascular accident and also a history of parkinsonism.  Of note is that she was noted to have hypercalcemia.  Calcium levels while she is in the hospital are elevated.  She had calcium levels most recently of 12.1.  She has had calcium levels in the upper 11s to low 12s.  She also has elevated glucose levels and potassiums that have been low.  Sodiums have been normal.  Her BUN has been increasing.  Her creatinine now is 1.8.  A parathyroid hormone assay was obtained and her parathyroid hormone was elevated at 99.  At that time, her calcium was 12.4 and she also has mild hypoalbuminemia.  She has had some mental dementia as evidenced by the mini state mental exam.  She is on hydrochlorothiazide, which could be a factor in her calcium.  PAST MEDICAL HISTORY:  Apparently she has had reasonably good health.  She has had a hysterectomy in the past.  MEDICATIONS:  Medications that she has been taking prior to this admission include hydrochlorothiazide 12.5 mg.  She has also been taking Plavix and Reglan at bedtime.  She has been taking Detrol.  FAMILY HISTORY:  Not adequately obtained at this time, but she denies any family history of thyroid disease.  PERSONAL HISTORY:  She was living by herself.  She is scheduled to probably go into a nursing  home.  PHYSICAL EXAMINATION:  A well-developed, elderly woman who appears oriented. Her answers are not clear.  She does have some rambling in her speech.  HEENT:  Her head is normocephalic.  NECK:  Supple.  No nodules are palpable.  However, the exam was limited by the patients position.  LUNGS:  Clear.  CARDIOVASCULAR:  Rhythm is regular.  ABDOMEN:  Soft.  No masses are present.  BREASTS:  No masses are present.  IMPRESSION:  Hyperparathyroidism.  DISCUSSION:  The patient has hyperparathyroidism that is evidenced by the laboratory data that is available.  The calcium is gradually increasing.  Her potassium is probably related to the hypercalcemia.  The creatinine of 1.8 is somewhat worrisome with a BUN that is also increasing.  Incidentally, also noted is the fact that she has probable diabetes as evidenced by her elevated glucoses.  Presumably these were done fasting, which will be diagnostic of diabetes.  The question ultimately evolves as to whether the patient should have surgery or not and whether her mental status is related to her hyperparathyroidism.  Unfortunately, there is no clear way to find this answer.  If her calcium were to be decreased, sometimes it will take some time before any mental clearing would occur.  However, I will give her thyrocalcitonin for two doses and there may be some improvement in her  mentation along with her hypercalcemia.  In the event that this were the case, then I think this would make one lean very enthusiastically towards surgery. The problem that will occur in the future is that her calcium will continue to be a problem.  More than likely this will be progressive over a chronic period of time, requiring different forms of therapy and medical forms of therapy are somewhat limited in their efficacy.  Therefore, ultimately this patient may come to surgery irrespective of her mental status.  Thank you for the opportunity in seeing this  patient. Dictated by:   Alfonse Alpers. Dagoberto Ligas, M.D. Attending Physician:  Terald Sleeper DD:  10/20/01 TD:  10/22/01 Job: [id number]

## 2011-05-03 NOTE — H&P (Signed)
NAME:  Isabella Mccoy, Isabella Mccoy NO.:  [id number]   MEDICAL RECORD NO.:  [id number]          PATIENT TYPE:  INP   LOCATION:  1833                         FACILITY:  [facility]   PHYSICIAN:  Lonia Blood, M.D.       DATE OF BIRTH:  10-Aug-1922   DATE OF ADMISSION:  04/03/2006  DATE OF DISCHARGE:                                HISTORY & PHYSICAL   PRIMARY CARE PHYSICIAN:  Dr. Baltazar Najjar with [facility].   CHIEF COMPLAINT:  Shortness of breath and chest discomfort.   HISTORY OF PRESENT ILLNESS:  Isabella Mccoy is an 77 year old woman with known  diastolic heart failure and atrial fibrillation who went to her primary care  physician's office today for complaints of increased lower extremity  swelling, increasing shortness of breath and increasing chest discomfort.  She reports that the most troublesome complaint for her was the persistent  swelling in her lower extremities despite increasing doses of Lasix. Ms.  Mccoy has also been getting very short of breath every time she does any  walking.   PAST MEDICAL HISTORY:  1.  Atrial fibrillation diagnosed in 2004.  2.  Hypertension.  3.  Diastolic heart failure.  4.  Osteoarthritis.  5.  Hiatal hernia.  6.  A right CVA in May, 2002.  7.  Hyperparathyroidism status post left superior parathyroidectomy in 2003.  8.  Status post hysterectomy.  9.  Diabetes mellitus type 2.   MEDICATIONS AT ASSISTED LIVING:  1.  Aspirin 325 mg daily.  2.  Diovan 80 mg daily.  3.  Furosemide 100 mg daily.  4.  Glipizide 5 mg daily.  5.  Metoprolol 25 mg daily.  6.  Multivitamin daily.  7.  Norvasc 10 mg daily.  8.  Tylenol p.r.n.  9.  Potassium 20 mEq b.i.d.  10. Trazodone 50 mg b.i.d.  11. Cipro 500 mg b.i.d. x10 days.   SOCIAL HISTORY:  Isabella Mccoy lives in an assisted living at [facility]. She walks with a walker. She has two sons, one living locally, the  other one living in [place]. She never smoked cigarettes and she  does not  drink alcohol.   FAMILY HISTORY:  Noncontributory.   REVIEW OF SYSTEMS:  Positive for a yeast infection in her inguinal area.  Also patient reports the fact that she has seen Dr. Terri Piedra from dermatology  who has put her on Cipro for a bacterial infection in the groin folds.  Otherwise she denies any dysuria. Denies constipation or diarrhea. She  denies any abdominal pain. No headache.   PHYSICAL EXAMINATION:  VITAL SIGNS:  On admission temperature is 98.1, pulse  74, blood pressure 110/60, respirations 16, saturation 98% on 2 liters of  oxygen.  GENERAL:  Isabella Mccoy appears to be in no acute distress. She is lying on  the stretcher, alert and oriented to place, person and time.  HEENT:  Head normocephalic, atraumatic. Eyes - pupils equal, round, react to  light and accommodation. Extraocular movements are intact. Throat is clear.  NECK:  Without JVD.  CHEST:  Clear  to auscultation bilaterally without wheezes, rhonchi or  crackles.  HEART:  Has irregularly irregular without murmurs, rubs, or gallops.  ABDOMEN:  Obese and soft, nontender, bowel sounds are present.  LOWER EXTREMITIES:  Have +3 bilateral puffy edema. Homan's sign is negative.  Distal pulses are present bilaterally.  SKIN:  Warm and dry.  NEUROLOGICAL EXAM:  The patient appears to have 4 out of 5 strength in all  four extremities bilaterally and intact sensation. Cranial nerves II-XII  appear to be intact.   LABORATORY DATA:  The patient's BNP is less than 30, troponin I is less than  0.05, CK-MB is 1, myoglobin 92. Sodium 141, potassium 4.9, chloride 111,  glucose 115, creatinine is 1.4.   The patient's EKG shows atrial fibrillation and some chronic changes of  negative T waves in the 2, 3,  aVF as well as V4 to V6 which has been seen on previous EKGs.   ASSESSMENT AND PLAN:  PROBLEM #1. Chest discomfort with resultant hypoxemia.  Isabella Mccoy will be admitted to [facility] telemetry unit. She  will  be ruled out for myocardial infarction with three sets of cardiac enzymes.  She will have a portable chest x-ray and ventilation perfusion scan, a D-  dimer and lower extremity Doppler ultrasound to look for possibility of PE.  Her aspirin, beta blocker will be continued and she will be kept on oxygen.  PROBLEM #2. Lower extremity edema. Do question the exact etiology of this.  Wonder if the patient does have a degree of venous insufficiency so for now  I will elevate her lower extremities and make sure she does not have a deep  venous thrombosis. Also I will discontinue patient's Norvasc which is  notorious to cause lower extremity edema. Given the fact that the patient is  hypotensive I will decrease her dose of Lasix and discontinue patient's  Diovan.  PROBLEM #3. Diabetes mellitus. This appears mild. Patient will be kept on  sliding scale Insulin and her CBGs will be checked frequently.      Lonia Blood, M.D.  Electronically Signed     SL/MEDQ  D:  04/03/2006  T:  04/03/2006  Job:  [id number]   cc:   Maxwell Caul, M.D.  Fax: [phone]

## 2011-05-03 NOTE — Consult Note (Signed)
[place]. [facility]  Patient:    Isabella Mccoy, Isabella Mccoy Visit Number: [id number] MRN: [id number]          Service Type: MED Location: 3000 3009 01 Attending Physician:  Terald Sleeper Dictated by:   Kelli Hope, M.D. Proc. Date: 10/05/01 Admit Date:  10/02/2001                            Consultation Report  DATE OF BIRTH:  [birth year]  REFERRING PHYSICIAN:  Terald Sleeper, M.D.  REASON FOR CONSULTATION:  Gait disorder and falls.  HISTORY OF PRESENT ILLNESS:  This is the initial inpatient consultation evaluation of this 77 year old woman with past medical history including hypertension who fell at home three days ago and could not get up. She was subsequently admitted to the hospital for this problem. She has actually had three falls over the past couple of months. She cannot describe the mechanism of her falls, other than she feels like her legs get a little bit weak. She does indicate that she does not get particularly dizzy and has not lost consciousness with the falls. She also notes over the past few months that she has generally gotten slower with walking and turning over in bed. She was felt to be parkinsonian on admission; and subsequently, neurological consultation was requested. She has had an MRI brain this admission which revealed no acute abnormality.  PAST MEDICAL HISTORY:  Remarkable for hypertension for many years. She had a right brain stroke in May which left her with a mild left hemiparesis. She also has asthma and gastroesophageal reflux disease.  SOCIAL HISTORY:  She has been living alone. However, her two sons are not likely to let her go back into such a situation. She does not use tobacco or alcohol. She was driving until a month ago. She says she is in control of her own financial affairs.  MEDICATIONS:  Reglan 10 mg p.o. q.h.s., Celebrex, Detrol, Sonata, Plavix, Inderal, Diovan, albuterol and Serevent  inhalers.  REVIEW OF SYSTEMS:  She has had some urinary incontinence for about a year, both stress and urge incontinence. She admits to symptoms of depression, including depressed mood, crying spells. She is hard of hearing.  PHYSICAL EXAMINATION:  VITAL SIGNS:  Temperature 96.7, blood pressure 205/100, pulse 75, respirations 20.  GENERAL/MENTAL STATUS:  She is alert and in no evident distress. She is completely oriented to time and place. However, she is generally slow and has some difficulty following complex commands. She registers 3/3 memory objects but only recalls one of them after distraction. She cannot perform concentration tasks, including spelling world backward or serial subtraction at all. Her speech is rather flat, but she has no defects to confrontational naming.  NECK:  Supple without carotid bruits.  HEART:  Regular rate and rhythm without murmurs.  NEUROLOGICAL:  Cranial nerves:  Funduscopic exam is benign. Pupils are equal and briskly reactive. Extraocular movements reveal restriction of vertical movements, particularly downward. Visual fields are full to confrontation. Face, tongue, and palate move normally and symmetrically. Hearing is decreased to conversational speech. Sensation is intact to pinprick in the face. She has a clear snout reflex but no clear glabellar reflex. Motor exam:  Normal bulk but there may be some minimally increased tone on the right. There is an 8 to 10 Hz fine tremor noted which is more postural than resting. Strength today reveals a mild left hemiparesis. Sensation is  intact to light touch and vibration and pinprick in all extremities. Reflexes are generally brisk throughout, but toes are downgoing and Hoffmann sign is absent. Rapid alternating movements performed slowly particularly on the left. Her gait is unsteady. She takes small steps and is a little bit wide based. She has some tendency to retropulsion and a decreased arm swing  and turns en bloc.  LABORATORY DATA:  MRI and MRA of the brain are personally reviewed. There is moderate atrophy with ventricular size not increased out of proportion to the cortical atrophy. There is also moderate small-vessel disease in the subcortical white matter. There is no acute finding.  MRA reveals no severe proximal stenoses.  IMPRESSION: 1. Gait disorder. Likely due to subcortical vascular disease with a mild    parkinsonian component. The parkinsonian component is probably vascular in    nature. 2. Strongly suspect a subcortical dementia. However, need to rule out    depression presenting as pseudo dementia.  RECOMMENDATIONS: 1. Agree with discontinuation of Reglan. 2. If this does not result in significant improvement alone, would consider    a trial of Sinemet 25/100 1/2 tablet p.o. t.i.d. with meals. 3. Would strongly consider an antidepressant, possibly in combination with a    low dose of a stimulant, such as Ritalin. 4. Consider MRI of the C-spine to rule out myelopathy. However, I think that    unlikely on clinical grounds.  Will follow with you. Thanks for the consult. Dictated by:   Kelli Hope, M.D. Attending Physician:  Terald Sleeper DD:  10/05/01 TD:  10/06/01 Job: [id number]

## 2011-05-03 NOTE — H&P (Signed)
[place]. [facility]  Patient:    Isabella Mccoy, Isabella Mccoy Visit Number: [id number] MRN: [id number]          Service Type: Attending:  Terald Sleeper, M.D. Dictated by:   Terald Sleeper, M.D.                           History and Physical  IDENTIFICATION:  This is a 77 year old woman, a patient of Dr. Lollie Marrow, who is admitted from home.  CHIEF COMPLAINT:  Fall, apparently last night.  She spent a long time on the floor and was unable to get back up.  HISTORY:  The patient fell today (? last night) and was unable to get back up. I am unable to get a clear history of the fall.  She states she was on the way to use the telephone.  She apparently has had three falls over the last two months.  The cause of these is uncertain.  There is no clear history of chest pain, palpitations, loss of consciousness, focal neurologic signs.  Her son says her gait has deteriorated over the last month.  She has a walker but was not using it.  She has had a small right CVA in May 2002 affecting her left hand; although she made a good functional recovery she still complains of numbness in the left hand.  She was also in the ER again with left hand numbness some weeks ago and apparently had a negative CT scan.  PAST MEDICAL HISTORY:  I do not have this womans old records, but includes mild left CVA, hypertension, urinary frequency, hiatal hernia, and osteoarthritis.  MEDICINES: 1. Diovan/hydrochlorothiazide 80/12.5 one p.o. q.d. 2. Celebrex 200 q.d. 3. Plavix 75 q.d. 4. Metoclopramide 10 q.h.s. 5. Sonata. 6. Propranolol 20 b.i.d. 7. Detrol 2 b.i.d. 8. Albuterol and Serevent p.r.n.  SOCIAL HISTORY:  She is at home alone.  She has two sons who are joint power-of-attorney.  The son who is here currently - Aneta Mins - is very concerned about his mothers return home.  He has already begun to make inquiries about assisted livings.  REVIEW OF SYSTEMS:  HEENT:  No  headache.  MOTOR:  There is no focal weakness. She complains of numbness that comes and goes in her left hand.  Son has noted gait decline over the last several months.  She apparently has had a walker but not using it.  CVS:  No chest pain, no palpitations.  ABDOMEN:  No nausea, no vomiting.  PHYSICAL EXAMINATION:  VITAL SIGNS:  Blood pressure 178/50.  GENERAL:  The patient is alert and responsive.  She is in a warming blanket.  HEENT:  Fundi reveal she has grade 2 hypertensive changes.  RESPIRATORY:  Clear.  CARDIOVASCULAR:  Heart sounds are normal.  There are no murmurs, no bruits, no gallops.  ABDOMEN:  No liver, no spleen, or tenderness.  EXTREMITIES:  There is no edema.  There is OA of the knees.  NEUROLOGIC:  Cranial nerves and extraocular movements are normal.  Motor: There is increased tone and cogwheeling.  There is decreased strength bilaterally in the upper extremities at 4/5.  There is left pronator drift. Reflexes:  Both Babinski responses are extensor.  There is 3+ briskness at both knee jerks.  The left brachioradialis and left biceps reflexes are brisker than the right.  She has a positive left Hoffmanns reflex.  LABORATORY WORK TODAY:  Reveals chest x-ray is  clear.  CT scan of the head is negative for acute changes.  Urinalysis shows many bacteria, 21-50 white cells.  EKG shows nonspecific ST-T changes in the inferior and anteroseptal leads.  On room air, pH 7.45, PCO2 27.4, PO2 62, bicarb 21.  White count 16.2, hemoglobin 14.1.  Sodium 144, potassium 2.9, chloride 110, CO2 24, BUN 14, creatinine 1.1.  SGPT 38 - otherwise, liver function tests are normal.  Total CK 434, MB 11.4, relative index is slightly elevated at 2.6, troponin I 0.1.  IMPRESSION: 1. Gait ataxia with falls. 2. Parkinsonism.  This could be either idiopathic Parkinsons disease,    vascular parkinsonism, Reglan induced parkinsonism, or as a remote    possibility, multiple system  atrophy combining the features of    parkinsonism and pyramidal tract signs. 3. Urinary tract infection. 4. Hypertension. 5. Hypokalemia. 6. Incontinence. 7. Elevated cardiac enzymes.  At this point, I doubt she has had an acute    myocardial injury.  I suspect she has rhabdomyolysis, giving this    pattern of elevated CK-MB and troponin.  She does, however, have mild ST    changes; therefore, the telemetry bed is justified.  PLAN:  Will admit to a telemetry bed and perform serial cardiac enzymes.  She will receive antibiotics, which I think can be given orally for a presumed UTI.  I think an MRI of the head is indicated to assess the degree of stroke damage here, giving a picture of "vascular parkinsonism."  PT and OT consults would seem indicated.  Her potassium will be replaced; presumably this is on the basis of her hydrochlorothiazide.  Son seems to be leaning towards placement and I have discussed this with him.  Hopefully, she will be able to be discharged to an assisted living level of care. Dictated by:   Terald Sleeper, M.D. Attending:  Terald Sleeper, M.D. DD:  10/02/01 TD:  10/03/01 Job: [id number]

## 2011-05-03 NOTE — Discharge Summary (Signed)
NAME:  Isabella Mccoy, Isabella Mccoy                          ACCOUNT NO.:  [id number]   MEDICAL RECORD NO.:  [id number]                   PATIENT TYPE:  ORB   LOCATION:                                       FACILITY:  [facility]   PHYSICIAN:  Lonia Blood, M.D.            DATE OF BIRTH:  [birth year]   DATE OF ADMISSION:  09/12/2003  DATE OF DISCHARGE:  09/23/2003                                 DISCHARGE SUMMARY   DISCHARGE DIAGNOSES:  1. Severe deconditioned state -- status post physical therapy and     occupational therapies in subacute care unit setting.  2. Admission to the acute units of [facility], August 28, 2003,     with acute renal failure, severe pyelonephritis and gram-negative rod     sepsis.  3. Newly diagnosed atrial fibrillation -- paroxysmal -- not a Coumadin     candidate secondary to multiple falls.  4. Acute diastolic congestive heart failure.     a. Uncontrolled atrial fibrillation.     b. Uncontrolled hypertension.  5. Hypoxemia secondary to diastolic congestive heart failure -- resolved.  6. Hypokalemia secondary to diuresis -- resolved.  7. Osteoarthritis.  8. Known hiatal hernia.  9. Small right hemispheric cerebrovascular accident, May 2002.  10.      Multi-infarct-type dementia -- mild.  11.      Hyperparathyroidism, status post left superior parathyroidectomy,     January 2003.  12.      Status post hysterectomy.  13.      Newly diagnosed diabetes mellitus, type 2.   DISCHARGE MEDICATIONS:  1. Toprol-XL 50 mg daily.  2. Multivitamin p.o. daily.  3. Altace 5 mg b.i.d.  4. Desyrel 50 mg b.i.d.  5. MiraLax 17 g a day with 8 ounces of water.  6. Glucotrol 5 mg daily.  7. Ensure liquid three times daily (t.i.d.).  8. Tylenol 1000 mg four times daily p.r.n.  9. Xanax 0.25 mg t.i.d. p.r.n.  10.      Enteric-coated aspirin 81 mg p.o. daily.   FOLLOWUP:  Ongoing care will be provided by Dr. Chilton Si, the patient's primary  care physician.  Followup issues  will be to follow CBGs on a twice-daily  (b.i.d.) basis and consider discontinuing Glucotrol if the patient is able  to be controlled on diabetic diet alone.  Other issues would include  rechecking of a basic metabolic panel in approximately one week to assure  that the patient's potassium, BUN and creatinine are all stable.  Because of  diastolic congestive heart failure, consideration should be given to  resuming potassium and Lasix therapy in the future, but at this time, the  patient appears to be sufficiently dehydrated.   PROCEDURES:  None.   CONSULTATIONS:  None.   HISTORY OF PRESENT ILLNESS:  Isabella Mccoy is a very pleasant 77 year old  female who suffered with a prolonged acute stay in the  hospital for  August 28, 2003 until September 12, 2003.  This admission was for acute  respiratory failure with pyelonephritis and gram-negative rod sepsis.  She  improved from this but was left in a severe deconditioned state and required  transfer to subacute care unit before being cleared for discharge back to  her assisted living facility.   HOSPITAL COURSE:  PROBLEM #1 - SEVERE DECONDITIONED STATE:  Isabella Mccoy  underwent PT and OT in the [facility] setting.  She was cleared for discharge to  assisted living with ongoing home health, PT and OT at the date of her  discharge.  She improved nicely with inpatient rehab and is felt to be  sufficiently stable to thrive in the assisted living setting.   PROBLEM #2 - ATRIAL FIBRILLATION:  This was newly diagnosed during the  patient's recent acute hospitalization.  She is not felt to be a Coumadin  candidate because of multiple recent falls and because of her ongoing  deconditioned state and need for PT/OT in the outpatient setting.  This is a  paroxysmal atrial fibrillation and the rate is consistently controlled.  She  will be treated with aspirin and followed closely.   PROBLEM #3 - ACUTE DIASTOLIC CONGESTIVE HEART FAILURE:  The patient  suffered  with acute diastolic congestive heart failure during her recent acute  hospitalization.  Echocardiogram revealed that systolic function was  preserved.  This was felt to be secondary to severely uncontrolled  hypertension and the newly diagnosed atrial fibrillation.  With control of  the patient's ventricular response rate and control of her blood pressure,  the patient's acute diastolic congestive heart failure became well-  compensated.  During her stay in the subacute care unit, Lasix and potassium  were discontinued, as the patient had become mildly dehydrated.  The  patient's volume status should be followed closely in assisted living  setting with consideration to resuming Lasix, should she become volume-  overloaded.   PROBLEM #4 - UNCONTROLLED HYPERTENSION:  The patient's blood pressure is  currently well-controlled on the above-listed regimen.  This should be  followed closely in the outpatient setting.                                                 Lonia Blood, M.D.    JTM/MEDQ  D:  09/23/2003  T:  09/23/2003  Job:  [id number]

## 2011-05-03 NOTE — H&P (Signed)
NAME:  Isabella Mccoy, Isabella Mccoy                          ACCOUNT NO.:  [id number]   MEDICAL RECORD NO.:  [id number]                   PATIENT TYPE:  ORB   LOCATION:                                       FACILITY:  [facility]   PHYSICIAN:  Lonia Blood, M.D.            DATE OF BIRTH:  December 01, 1922   DATE OF ADMISSION:  09/12/2003  DATE OF DISCHARGE:                                HISTORY & PHYSICAL   CHIEF COMPLAINT:  Severe deconditioned state.   HISTORY OF PRESENT ILLNESS:  Isabella Mccoy is a 77 year old female whom I have  been following in the acute units after she presented initially with acute  renal failure related to pyelonephritis and gram negative rod sepsis.  During the course of her hospitalization, she also developed acute diastolic  congestive heart failure secondary to atrial fibrillation and uncontrolled  hypertension.  Because of her prolonged stay in the acute unit, she has  become significantly deconditioned. She lives in assisted living facility.  She would like to return to this same assisted living facility but at this  point is not yet strong enough physically to be safe in this environment.  As a result, we have decided to transfer her to the subacute care unit for  ongoing conditioning and discharge her to assisted living at such time that  she becomes stable on her feet.   PAST MEDICAL HISTORY:  1. Admission to hospital August 28, 2003, with acute renal failure,     pyelonephritis, and gram negative rod sepsis.  2. Atrial fibrillation - newly diagnosed - not a Coumadin candidate     secondary to multiple falls.  3. Acute diastolic congestive heart failure.     a. Uncontrolled atrial fibrillation.     b. Uncontrolled hypertension.  4. Hypoxemia secondary to diastolic congestive heart failure.  5. Uncontrolled hypertension.  6. Hypokalemia secondary to diuresis.  7. Osteoarthritis.  8. Known hiatal hernia.  9. Small right hemisphere CVA May 2002.  10.       Multi-infarct type dementia - mild.  11.      Hyperparathyroidism status post left superior parathyroidectomy     January 2003.  12.      Status post hysterectomy.   MEDICATIONS:  1. Desyrel 50 mg p.o. b.i.d.  2. Albuterol nebulizer 2.5 mg q.i.d.  3. Atrovent nebulizer 0.5 mg q.i.d.  4. Toprol XL 50 mg p.o. daily.  5. Lasix 60 mg p.o. b.i.d.  6. Multivitamin p.o. daily.  7. Altace 5 mg p.o. b.i.d.  8. Potassium chloride 30 mEq p.o. daily.  9. Tylenol 1000 mg p.o. q.6h. p.r.n.  10.      Vicodin 5/500 one to two p.o. q.6h. p.r.n.  11.      Laxative of choice/enema of choice p.r.n.  12.      Xanax 0.25 mg p.o. t.i.d. p.r.n.  13.      O2 p.r.n. to  keep saturations greater than or equal to 88%.   ALLERGIES:  No known drug allergies.   FAMILY HISTORY:  Noncontributory secondary to age.   SOCIAL HISTORY:  The patient lives at [facility].  She  does not drink nor does she smoke tobacco.   LABORATORY DATA:  Sodium 136, potassium 3.7, chloride 101, CO2 27, BUN 14,  creatinine 1.1, glucose 164.   PHYSICAL EXAMINATION:  GENERAL APPEARANCE:  Obese 77 year old female who  appears comfortable today, in no acute respiratory distress.  VITAL SIGNS:  Temperature 97.0, blood pressure 119/75, heart rate 96,  respiratory rate 20, O2 saturation 95% on two liters.  HEENT:  Normocephalic and atraumatic.  Pupils are equal, round, reactive to  light and accommodation.  Extraocular muscles intact bilaterally.  OC/OP  clear.  LUNGS:  Bibasilar crackles without wheeze or rhonchi.  CARDIOVASCULAR:  Irregular rhythm with a regular rate and no murmurs, rubs,  or gallops.  ABDOMEN:  Mildly obese, nontender, nondistended, soft, bowel sounds present,  no hepatosplenomegaly, no rebound, no ascites.  EXTREMITIES:  There are 2+ bilateral lower extremity edema.  NEUROLOGIC:  There is 4+/5 strength throughout.  Cranial nerves II-XII  intact bilaterally. Alert and oriented x3.   IMPRESSION  AND PLAN:  1. Severe deconditioned state - The patient is being admitted to [facility].  At     such time that she is sufficiently retrained, she will be discharged to     assisted living.  2. Atrial fibrillation - We will continue to strive for rate control.     Should the patient become more stable  on her feet, will consider     Coumadin therapy.  3. Uncontrolled hypertension - We will follow her blood pressures closely.     At this time, blood pressure is well controlled.  We will titrate     medicines further as needed.  4. Diastolic congestive heart failure - The patient is beginning to     stabilize.  She is diuresing well.  Control of ventricular response rate     and hypertension are of most importance.  We will continue diuresis at     the above listed dose.  We will follow potassium closely.  5. Hypokalemia - Potassium will be followed closely with ongoing aggressive     diuretic therapy.  Magnesium will be checked as needed.  6. Mild hyperglycemia - Serum blood glucoses are returning mildly elevated.     This could be secondary to inhaled Pulmicort therapy.  Pulmicort is being     discontinued.  We will follow CBG on a b.i.d. basis and treat as     necessary.                                                Lonia Blood, M.D.    JTM/MEDQ  D:  09/12/2003  T:  09/12/2003  Job:  [id number]

## 2011-05-03 NOTE — Discharge Summary (Signed)
[place]. [facility]  Patient:    Isabella Mccoy, Isabella Mccoy Visit Number: [id number] MRN: [id number]          Service Type: ECR Location: [facility] 4502 01 Attending Physician:  Terald Sleeper Dictated by:   Lorin Picket. Claiborne Billings, N.P. Admit Date:  10/13/2001 Disc. Date: 11/02/01                             Discharge Summary  DATE OF BIRTH:  June 15, 1922  CHIEF COMPLAINT:  Deconditioning requiring ongoing physical and occupational therapy.  HISTORY OF PRESENT ILLNESS:  This elderly female was followed in primary care by Dr. Murray Hodgkins at [facility].  Actually admitted to hospital 10/02/01. Dictated by:   Lorin Picket. Claiborne Billings, N.P. Attending Physician:  Terald Sleeper DD:  11/02/01 TD:  11/02/01 Job: [id number]

## 2011-05-03 NOTE — Consult Note (Signed)
[place]. [facility]  Patient:    Isabella Mccoy, Isabella Mccoy. Visit Number: [id number] MRN: [id number]          Service Type: Attending:  Veverly Fells. Vernie Ammons, M.D. Dictated by:   Veverly Fells Vernie Ammons, M.D. Proc. Date: 10/08/01   CC:         Terald Sleeper, M.D.   Consultation Report  Room Number 3009  This very pleasant 77 year old white female is seen in consultation today for further evaluation of urinary incontinence.  She was admitted with a recent fall, unable to arise after that, and increasing history of recent falls.  She has had a prior CVA.  She, according to her family, has had a history of urge incontinence, and there is the possibility of some stress incontinence as well.  In talking with the patient, she reports urgency and inability to get to the bathroom in time before she becomes incontinent.  Because of that, she had postvoid residuals checked, and they have been running in the 300 cc range.   She currently denies dysuria, Hematuria, history of stones.  She has had prior UTI.  PAST MEDICAL HISTORY:  Mild left CVA, hypertension, urinary frequency, hiatal hernia, osteoarthritis.  PAST SURGICAL HISTORY:  She has had a cholecystectomy and hysterectomy.  CURRENT MEDICATIONS:  Tequin, Plavix, Inderal, Detrol 2 mg b.i.d., Norvasc, potassium, Avapro, Urecholine 25 mg t.i.d., albuterol, Ativan.  ALLERGIES:  No known drug allergies.  SOCIAL HISTORY:   She currently lives alone.  She has two sons who are joint power of attorney, and she has a son and daughter-in-law who live across the street.  She denies tobacco or alcohol use.  FAMILY HISTORY:  Negative for GU malignancy or renal disease.  REVIEW OF SYSTEMS:  Per Health History Assessment Sheet, Section 2, which was reviewed and signed.  PHYSICAL EXAMINATION:  VITAL SIGNS:  Temperature 97.1, pulse 81, respirations 24, blood pressure 173/76.  GENERAL:  The patient is a well-developed,  well-nourished white female in no apparent distress who appears to have diminished facial expressions, almost a mask facies.  She is alert and oriented but seems somewhat somnolent.  HEENT:  Atraumatic, normocephalic.  Oropharynx is clear.  CARDIOVASCULAR:  Regular rate and rhythm, no murmurs.  LUNGS:  Clear to auscultation.  ABDOMEN:  Nontender without mass or hepatosplenomegaly.  She has no CVA tenderness.  GU:  Normal external female genitalia, normally placed urethral meatus.  Good support of the anterior vaginal wall.  No periurethral lesions or tenderness. Her cervix and uterus are surgically absent.  She had no vaginal lesions or discharge, no rectocele.  She has normal anus and perineum.  EXTREMITIES:  No edema.  Osteoarthritis noted in the knees.  NEUROLOGIC:  She has increased tone and cogwheeling with decreased strength in the upper extremities bilaterally.  There is briskness in both knee jerks.  LABORATORY DATA:  CT scan of the head was negative for any acute changes.  Chest x-ray is clear.  Her urine culture on October 22 had greater than 100,000 colonies, was recultured and remains pending.  Calcium elevated at 11.3 with a creatinine of 1.0.  She has elevated parathyroid hormone consistent with hyperparathyroidism.  IMPRESSION: 1. This patient appears to have progressive mental deterioration and increased    falls.  She has, by history, a bladder that is acting like that seen in    Parkinsons disease (hypotonic detrusor with spasticity).  This results in    a sense of urgency and frequency but,  due to poorly sustained detrusor    contractions, it results in elevated postvoid residual and decreased    functional volume with increased frequency and incontinence.  It also    results in weakened external sphincter which can worsen her incontinence    and cause some stress incontinence.  There is no medical treatment that is    going to work in this situation. 2.  She has hyperparathyroidism clinically but no history of stones. 3. She appears to have some bacteria that may be contaminant, cultures    pending.  RECOMMENDATIONS: 1. She is currently on Urecholine.  That could be increasing her sense of    urgency and can be stopped. 2. She is also on Detrol which will decrease urgency but causes increased    postvoid residual and also needs to be stopped. 3. I had a long discussion with her and her family about the placement of    Foley catheter.  I think that is the best way to manage this patients    bladder at least in the short term.  It will keep her drier; she will have    less sense of urgency and frequency.  She will not have to get up as much.    She will not be sitting urine from her incontinence, and she will not feel    the need to get up and potentially fall.  The family is in agreement    with the catheter placement. 4. She will follow up with me as an outpatient in about four weeks to    reassess. Dictated by:   Veverly Fells Vernie Ammons, M.D. Attending:  Veverly Fells. Vernie Ammons, M.D. DD:  10/08/01 TD:  10/09/01 Job: [id number]

## 2011-05-03 NOTE — Discharge Summary (Signed)
NAME:  Isabella Mccoy, Isabella Mccoy                          ACCOUNT NO.:  [id number]   MEDICAL RECORD NO.:  [id number]                   PATIENT TYPE:  INP   LOCATION:  5702                                 FACILITY:  [facility]   PHYSICIAN:  Lonia Blood, M.D.            DATE OF BIRTH:  09-07-1922   DATE OF ADMISSION:  08/28/2003  DATE OF DISCHARGE:  09/12/2003                                 DISCHARGE SUMMARY   DISCHARGE DIAGNOSES:  1. Severe deconditioned state. The patient is being transferred to the     subacute care unit.  2. Acute diastolic congestive heart failure exacerbation.     A. Newly diagnosed atrial fibrillation with rapid ventricular response.     B. History of poorly controlled hypertension.  3. Newly diagnosed atrial fibrillation, rate controlled.     A. Exceedingly a poor candidate for Coumadin therapy.  4. Long history of hypertension, poorly controlled.  5. History of small right hemispheric cerebrovascular accident May 2002.  6. Known  hiatal hernia.  7. Multi-infarct type dementia, mild.  8. Hyperparathyroidism, status post left superior parathyroidectomy January     2003.  9. Status post hysterectomy.  10.      Hypokalemia secondary to diuretic therapy, resolved.  11.      Pyelonephritis, resolved, antibiotic course completed.  12.      Acute renal failure with presenting creatinine of 3.6.  13.      Gram negative rod sepsis.   DISCHARGE MEDICATIONS:  Ultimate discharge medication regimen to be  determined upon discharge in subacute care unit.   FOLLOW UP:  To be determined after discharge from subacute care unit.   PROCEDURE:  Echocardiogram September 06, 2003. Atrial fibrillation. Overall  left ventricular systolic function normal. Ejection fraction 55% to 65%.  Left ventricular wall thickness mildly increased.   CONSULTATIONS:  None.   HISTORY OF PRESENT ILLNESS:  Ms. Isabella Mccoy is a 77 year old female who  presented to the hospital on August 28, 2003  with complaints of  generalized weakness and falls. She was previously residing at [facility]. The patient's main complaint was being extremely  weak with multiple small falls at her assisted living facility. During the  last fall, prior to her presentation, she was so weak that she was not able  to get up from the floor.   HOSPITAL COURSE BY PROBLEM:  1. Pyelonephritis with gram negative rod sepsis. Ms. Daddona presented to     the hospital with a chief complaint of severe deconditioned state and     weakness. She was found to be in acute renal failure. The etiology was     felt to be pyelonephritis. Urinalysis was consistent with such. Two blood     cultures were obtained and were in fact, positive for e-coli bacteremia.     The patient was treated with a complete course of antibiotic therapy. She  responded to this well. At the time of discharge from the acute unit,     there was no evidence of ongoing infection.  2. Acute renal failure. The patient presented to the hospital with no prior     history of acute renal failure. Creatinine at the time of presentation     was markedly elevated at 3.6 with a BUN of 46. This was felt to be the     cause of the patient's significant lethargy. This was felt to be a pre-     renal azotemia secondary to gram negative rod sepsis and resultant     hypotension. The patient was hydrated aggressively. Her pyelonephritis     was corrected with IV antibiotics. With resolution of her pyelonephritis,     her renal function improved to normal. At the time of discharge, her     creatinine is within normal range at 1.2.  3. Acute diastolic congestive heart failure. After such time that the     patient improved from her severe dehydration and gram negative rod sepsis     with acute renal failure, she began to develop significant shortness of     breath. She became O2 dependent with significant hypoxemia. Chest x-ray     revealed  evidence of probable bilateral pulmonary edema. This was felt to     be secondary to fluid resuscitation. Around this same time, the patient     was also appreciated to have converted to an atrial fibrillation.     Echocardiogram was obtained. There was no evidence of injury or cardiac     source of thrombus but there was evidence of significant diastolic     congestive heart failure. The patient's volume overload was felt to be     secondary to diastolic congestive heart failure in the setting of     uncontrolled atrial fibrillation. With control of the ventricular     response rate, the patient improved significantly. Diuresis was carried     out. Clinically, the patient was much improved at time of her transfer to     [facility]. Beta blocker and ace inhibitor were both administered in attempts     to maximize control of the patient's diastolic congestive heart failure.  4. Atrial fibrillation. As noted above, the patient was found to convert     into atrial fibrillation during this hospitalization. This significantly     exacerbated a moderate diastolic congestive heart failure. The patient     was not felt to be an appropriate candidate for Coumadin because of     multiple recent falls and instability on her feet. Ventricular response     rate was accomplished initially with Cardizem, which was able to be     changed over to Toprol. At time of discharge from the acute unit, she     remains in atrial fibrillation but her heart rate is well controlled at     its target range. Should she become more stable on her feet,     consideration should be given to initiating Coumadin therapy.  5. Uncontrolled hypertension. The patient exhibited severe uncontrolled     hypertension during this hospitalization with systolics in the 180 to 190     range. This was felt to be clearly exacerbating her diastolic heart    failure. Strict blood pressure control is the goal. We will continue to     follow this  closely after patient's transfer to the subacute care unit  and titrate medications as necessary. On date of discharge, patient's     blood pressure is at goal with systolics from 119 to 140.                                                Lonia Blood, M.D.    JTM/MEDQ  D:  09/12/2003  T:  09/12/2003  Job:  [id number]

## 2011-05-03 NOTE — H&P (Signed)
[place]. [facility]  Patient:    VERLYN, DANNENBERG Visit Number: [id number] MRN: [id number]          Service Type: ECR Location: SACU 4502 [id number] Attending Physician:  Terald Sleeper Dictated by:   Lorin Picket. Claiborne Billings, N.P. Admit Date:  10/13/2001   CC:         Mark C. Vernie Ammons, M.D.   History and Physical  DATE OF BIRTH:  [birth year]  CHIEF COMPLAINT:  Gait instability and parkinsonism.  HISTORY OF PRESENT ILLNESS:  In brief, this elderly female was admitted to [facility] on October 02, 2001, with gait instability and falls.  Her evaluation noted parkinsonian symptomatology.  This was thought likely secondary to Reglan use, which was discontinued.  Neurology consultation with Dr. Kelli Hope.  He agreed with discontinuance, but felt the patient may also need an antidepressant and possibly a combination Reglan at some point.  He also recommended an MRI of the brain, which was done on October 04, 2001, showing chronic ischemic changes, but no acute abnormality.  It was felt by Dr. Thad Ranger that the gait disorder was likely subcortical vascular disease, with mild parkinsonian component, and strongly suspected subcortical dementia.  At admission, a suspected urinary tract infection and started Tequin antibiotic therapy.  Urine culture returned showing greater than 100,000 colony count of Staph coagulase negative, resistant to quinolones antibiotic therapy.  The patient was switched to Septra DS and will continue a 10-day course.  The patient appeared to have urinary retention, and a Foley catheter was placed, finding 300 cc of residual.  A urology consultation obtained with Dr. Loraine Leriche C. Ottelin.  He felt the patient was suffering a hypertonic detrusor with spasticity, secondary to parkinsonian symptomatology and dementia.  He recommended to stop the previous medication Urecholine and Detrol, and place a Foley catheter for  short-term management.  Requested followup with the patient in approximately four weeks. The patient also had some mild leukocytosis, of unclear etiology.  This seems to be self-resolving.  Noted an elevated serum calcium at admission.  A sedimentation rate was checked, and this was normal at 8.  A serum PTH was checked, and this returned elevated at 141.  Serum calcium levels will be followed closely at admission to [facility], but no specific interventions at this point.  Other issues, including acute delirium over the hospital course.  Suspect a baseline dementia compounding delirium.  This appears to have slowly resolved over the course of hospitalization, but the patient appears to remain significantly demented.  A Mini-Mental Status Exam was administered with the patients score of 15/30.  This will need to be followed up again at some point in the near future.  At hospital admission, the patient was noted to have some ST changes and elevated CPKs, and mildly elevated MBs, with a troponin I elevation as well. Initially placed on the telemetry unit.  These enzymes gradually subsided and no cardiac event was thought to have occurred.  In fact, it was felt that the elevated CPK-MB, CPK, and troponin were all secondary to fall trauma and prolonged time on the ground, causing mild rhabdomyolysis.  There was no damage to the renal function observed through the basic metabolic panels over the course of hospitalization.  Ethical discussions over the course of the hospitalization were conducted with the patients family.  It was not felt that she could return to independent living, considering the mental deficits noted.  Her family agreed to  an assisted living placement, which would accommodate a Foley catheter, at least for the short-term period.  Per gait stability and falls, it was not felt that the patient was yet at assisted living, per physical function, and the decision was made to place at  the subacute care unit for ongoing physical and occupational therapy.  Her oral intake was noted to be marginal to inadequate, and a nutritional evaluation estimated calorie requirements of 1600-1700, protein requirements 80-90 g. Diet:  Per nutritional recommendation was done, graded a regular, in attempts to improve intake.  This will also need to be followed closely.  PAST MEDICAL HISTORY  1. Old left CVA, with minimal residual.  2. Hypertension.  3. Urinary frequency, now noted urinary retention, and a Foley catheter     placement.  4. Hiatal hernia.  5. Osteoarthritis.  6. Staph coagulase negative urinary tract infection in October 2002.  7. Probably some underlying chronic obstructive pulmonary disease.  8. Acute delirium October 2002, resolved.  9. Probable moderate cortical dementia. 10. Elevated PTH and serum calcium with suspected hyperparathyroidism,     as yet of unclear etiology.  SOCIAL HISTORY:  The patient lives alone at home.  Has two sons who are joint power of attorney.  The family has noted gait instability and falls over the past few months.  Noted declining mental function.  Inquiries have been in the works for assisted living placement.  REVIEW OF SYSTEMS:  HEENT:  No headache.  MOTOR:  No focal weakness, but does complain of some numbness in the left hand, status post CVA, that comes and goes.  The family again has noted decline in gait over the past several months.  The patient had a walker, but had not used it.  CARDIOVASCULAR:  No chest pain or palpitations.  LUNGS:  No cough or shortness of breath. ABDOMEN:  No nausea or vomiting.  Noted declined oral intake since the original hospitalization.  GENITOURINARY:  A recent history of urinary tract infection as well as urinary retention, now a Foley catheter placed, with followup requested with Dr. Vernie Ammons of urology.  MUSCULOSKELETAL:  Some left-sided weakness, status post CVA.  Some parkinsonian symptoms  noted on original admission on October 02, 2001, thought secondary to Reglan, with  symptomatology improved post-discontinuance of Reglan.  PHYSICAL EXAMINATION  VITAL SIGNS:  On admission temperature 97.1 degrees, pulse 76, respirations 20, blood pressure 156/70.  GENERAL:  A well-developed elderly female, in no acute distress.  She is alert and cooperative with the exam.  Moderately confused.  HEENT:  Fundi reveal grade 2 hypertensive changes.  Ears:  Canals clear though hearing appears mildly reduced.  Nose:  Nares patent, without masses or discharge noted. Oral mucosa pink and moist.  NECK:  No jugular venous distention or bruits noted.  No adenopathy noted.  HEART:  Rate and rhythm regular, without murmur, S3, or S4. No significant edema or jugular venous distention noted.  LUNGS:  Breath sounds clear and equal bilaterally.  No distress or cough noted.  BREASTS:  Deferred as noncontributory.  ABDOMEN:  Soft, round, with positive bowel sounds.  No pain or masses with palpation.  No organomegaly noted.  GENITOURINARY:  No bladder pain or CVA tenderness noted.  The patient has a Foley catheter placed, running clear yellow urine.  RECTAL:  Deferred, noncontributory.  MUSCULOSKELETAL:  Some decreased strength bilaterally estimated at 4/5.  There is some left pronator drift.  Strength even further decreased in the lower extremity, though roughly  equal at 3/5.  No indication of trauma.  NEUROLOGIC:  Cranial nerves II-XII grossly intact.  Extraocular movements appear normal.  Previously-noted increased tone and cogwheeling.  Appeared to have essentially resolved.  The patient continues to have a mild upper extremity tremor.  Deep tendon reflexes 2+ at the knees bilaterally.  Toes were downgoing bilaterally.  INTEGUMENTARY:  Exam is unremarkable.  LABORATORY DATA:  On admission a follow-up urinalysis appears negative on October 13, 2001.  Follow-up urine culture shows no  growth.  Metabolic panel on October 14, 2001, found sodium 140, potassium slightly low at 3.4, chloride 110, CO2 of 23, glucose high at 163, BUN 15, creatinine 1.5.  Calcium elevated at 11.9.  IMPRESSION 1. 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 2. Multi-infarction dementia. 3. Urinary retention, currently with a Foley catheter and followup is    requested four weeks postdischarge, with Dr. Vernie Ammons. 4. Acute delirium, appears resolved, ? secondary to urinary tract infection,    ? secondary to hypercalcemia. 5. Elevated serum calcium and parathyroid hormone, with suspected    hyperparathyroidism, of unclear etiology. 6. Falls and ataxia, requiring physical and occupational therapy prior to    assisted living placement.  PLAN:  Admit to the subacute care unit for ongoing physical and occupational therapy, as well as nutritional followup.  A Mini-Mental Status Exam done reveals a score of 15/30.  Followup of serum calcium is required, and would treat any serum calcium level above 12.0 with Calcitonin.  Also possible CT scan of the neck, to evaluate the parathyroid gland, per the discretion of Dr. Terald Sleeper.  The staff has noted some insomnia, and would start a low dose of trazodone 25 mg q.d.  The staff to follow postural blood pressure checks, with the initiation of this medication.  Urine appears clear of any infection with antibiotic therapy.  Further orders and followup as per a discussion with Dr. Leanord Hawking. Dictated by:   Lorin Picket. Claiborne Billings, N.P. Attending Physician:  Terald Sleeper DD:  10/15/01 TD:  10/15/01 Job: [id number]

## 2011-05-03 NOTE — Consult Note (Signed)
[place]. [facility]  Patient:    Isabella Mccoy, Isabella Mccoy Visit Number: [id number] MRN: [id number]          Service Type: ECR Location: SACU [id number] Attending Physician:  Terald Sleeper Dictated by:   Ollen Gross. Vernell Morgans, M.D. Proc. Date: 10/26/01 Admit Date:  10/13/2001                            Consultation Report  REASON FOR CONSULTATION:  Ms. Isabella Mccoy is a 77 year old white female who was admitted to the hospital almost a month ago after sustaining a fall.  The etiology of her fall has not been completely clear.  She was found to be somewhat slow with her mental state but had no other focal neurologic signs. She does have a history of a couple of small strokes.  During her hospitalization she was found to have some elevated calcium levels, and further workup of this has shown that she has some slightly increased levels of parathyroid hormone consistent with hyperparathyroidism as well as a sestamibi scan that seems to localize a possible parathyroid adenoma.  She, on admission, was also on hydrochlorothiazide which can also cause hypercalcemia, and since that time this medicine has been stopped.  She continues to run calcium levels in the 11 to 12 range, which seem to be under control.  She has no other complaints of pains in her extremities, joints, or abdomen.  She states that she does have problems with shortness of breath at times but denies any chest pain.  PAST MEDICAL HISTORY:  Significant for stroke, hypertension, hiatal hernia, osteoarthritis.  PAST SURGICAL HISTORY:  Includes a hysterectomy.  She states a surgery for a hiatal hernia.  MEDICATIONS ON ADMISSION:  Included Diovan and hydrochlorothiazide, Celebrex, Plavix, Reglan, Sonata, propranolol, Detrol, albuterol and Serevent inhalers.  SOCIAL HISTORY:  She denied any use of alcohol or tobacco products.  FAMILY HISTORY:  Noncontributory.  ALLERGIES:  No known drug  allergies.  PHYSICAL EXAMINATION:  GENERAL:  She is an elderly white female in no acute distress, somewhat sleepy and fell asleep multiple times during the interview.  HEENT:  Extraocular muscles were intact.  Pupils were equal and reactive.  SKIN:  Warm and dry with no jaundice.  LUNGS:  Clear bilaterally.  HEART:  Regular rate and rhythm.  ABDOMEN:  Soft and nontender.  EXTREMITIES:  Showed no cyanosis, clubbing, or edema.  NEUROLOGIC:  She was alert and oriented x 3 but somewhat tired and lethargic at times.  ASSESSMENT AND PLAN:  This is a 77 year old white female with a history of a stroke who was found to have hyperparathyroidism with some mild elevation of her calcium levels, as well as a sestamibi scan that seemed to localize a possible parathyroid adenoma.  It is not clear how much her elevated calcium levels are contributing to her current mental status but it seems unlikely that her calcium level is the major factor in her current mental status, given her lack of other constitutional symptoms of hypercalcemia and hyperparathyroidism.  Certainly, I agree that managing her calcium levels probably will continue to be problematic as time goes on, and given her current findings, she may be amenable to a minimally-invasive parathyroidectomy.  Prior to offering her such a procedure I would be in favor of clearing her from a cardiac standpoint as well as from a stroke standpoint, with particular attention to clearance of her carotid arteries.  I  think if she were a candidate for surgery from a medical standpoint, a parathyroidectomy could be arranged on an elective basis. We will certainly continue to follow her while she is here in the hospital and appreciate the opportunity to help with the care of this patient. Dictated by:   Ollen Gross. Vernell Morgans, M.D. Attending Physician:  Terald Sleeper DD:  10/26/01 TD:  10/26/01 Job: [id number]

## 2011-05-03 NOTE — H&P (Signed)
NAME:  Isabella Mccoy, Isabella Mccoy NO.:  [id number]   MEDICAL RECORD NO.:  [id number]                   PATIENT TYPE:  EMS   LOCATION:  MAJO                                 FACILITY:  [facility]   PHYSICIAN:  Lonia Blood, M.D.            DATE OF BIRTH:  1922-02-16   DATE OF ADMISSION:  08/28/2003  DATE OF DISCHARGE:                                HISTORY & PHYSICAL   CHIEF COMPLAINT:  Falls with weakness.   HISTORY OF PRESENT ILLNESS:  Isabella Mccoy is a 77 year old female with mild  multi-infarct-type dementia, who lives in [facility].  She presented to the emergency room via EMS today, after suffering a fall at  home.  During this fall she suffered an abrasion to the right elbow.  She  was extremely weak, however, and was not able to get herself up out of the  floor.  She reports about three days history of diarrhea, which she  describes as soft but not purely liquid stool.  She also describes  significant decrease in appetite.  She complains of severe dry mouth.  She  denies abdominal pain, chest pain, vomiting; but, does endure nausea.  She  has had no melena or hematochezia.  There has been no hematemesis or emesis  of any sort for that matter.  Currently, she continues to feel extremely  weak, but has no other complaints.   REVIEW OF SYSTEMS:  As full review of systems is entirely negative, with the  exception of elements noted in the history of present illness above.   PAST MEDICAL HISTORY:  1. Small right hemisphere cerebrovascular accident, May 2002.  2. Hypertension.  3. Hiatal hernia.  4. Osteoarthritis.  5. Multi-infarct type dementia -- mild.  6. Hyperparathyroidism, status post left superior parathyroidectomy in     January 2003.  7. Status post hysterectomy.   OUTPATIENT MEDICATIONS:  1. Trazodone.  2. Senna.  3. Tylenol as needed.  4. Ultracet as needed.  5. Bisacodyl as needed.   ALLERGIES:  NO KNOWN DRUG  ALLERGIES.   FAMILY HISTORY:  Noncontributory, secondary to age.   SOCIAL HISTORY:  The patient lives at [facility].  She  does not drink nor does she smoke.   DIAGNOSTIC DATA:  Chest x-ray revealing no acute disease and cardiomegaly.   BUN 46, creatinine 3.6, potassium 3.4, sodium 132, chloride 99, CO2 20.  Glucose 153.  SGOT 29, SGPT 22.  Alkaline phosphatase 89, total bilirubin  1.2, total protein 6.5, albumin 2.9.  Guaiac negative.  Hemoglobin 13.9, MCV  94, glucose 148, white count 18.7 with a left shift.   PHYSICAL EXAMINATION:  VITAL SIGNS:  Temperature 98.2, blood pressure  112/72, heart rate 84, respiratory rate 18, O2 saturation 100% on room air.  GENERAL:  Well developed, well nourished, mildly overweight female; in no  acute respiratory distress.  She is lying comfortably  in a hospital  stretcher.  HEENT:  Normocephalic and atraumatic.  Pupils equal, round and reactive to  light and accommodation.  Extraocular muscles are intact bilaterally.  OC/OP  clear, with the exception of markedly dry oral mucosa.  NECK:  Scar consistent with parathyroid surgery, but no lymphadenopathy or  thyromegaly appreciable.  LUNGS:  Clear to auscultation throughout, without wheezes or rhonchi.  CARDIOVASCULAR:  Regular rate and rhythm without murmurs, rubs or gallops.  Normal S1 and S2.  ABDOMEN:  Moderately overweight; nontender, nondistended and soft.  Bowel  sounds are present.  No rebound, no ascites, no mass.  EXTREMITIES:  Small superficial abrasion to the right elbow, consistent with  shear force injury; with no surrounding erythema or discharge.  No  significant lower extremity cyanosis, clubbing or edema otherwise.  NEUROLOGIC:  Cranial nerves II-XII intact bilaterally.  Strength 4+/5  bilateral upper and lower extremities.  Intact sensation and touch  throughout. No Babinski.   IMPRESSION AND PLAN:  1. ACUTE RENAL FAILURE.  Isabella Mccoy is suffering with  acute renal failure,     with a creatinine of 3.6 and a BUN of 46.  Creatinine is 0.9 in January     2003.  The clinical picture supports a severe prerenal azotemia.  She     will be hydrated aggressively with IV fluids, with care being given to     avoiding fluid overload (given her advanced age).  We will check a     urinalysis to assure that this is not a pyelonephritis, but I am     suspicious of this.  The patient will be covered empirically with Cipro,     adjusted to her renal function, until such time that the culture is able     to be obtained and we are able to better clarify the exact nature of the     patient's infection (if there is, in fact, one).  2. WEAKNESS WITH FALLS.  This is most likely secondary to severe     dehydration.  We will correct the patient's dehydration and follow.  3. SEVERE DEHYDRATION.  The exact etiology of this is not clear.  The     patient has had 2-3 day history of diarrhea.  This was most likely the     cause of her dehydration.  She does live in a facility, and therefore I     am concerned that this could be Pseudo phycomycetes.  I will hold on     Flagyl until such time that C. difficile toxins are obtained.  If this     does prove to be C. difficile, Cipro will be discontinued abruptly.  The     patient will be hydrated.  We will follow her closely.  4. HISTORY OF HYPERTENSION.  Blood pressures are currently well controlled     and no agents will be necessary.                                                Lonia Blood, M.D.    JTM/MEDQ  D:  08/28/2003  T:  08/28/2003  Job:  [id number]

## 2011-05-03 NOTE — Op Note (Signed)
[place]. [facility]  Patient:    Isabella Mccoy, Isabella Mccoy Visit Number: [id number] MRN: [id number]          Service Type: DSU Location: [phone] Attending Physician:  Caleen Essex Dictated by:   Ollen Gross. Vernell Morgans, M.D. Proc. Date: 01/04/02 Admit Date:  01/04/2002 Discharge Date: 01/05/2002                             Operative Report  PREOPERATIVE DIAGNOSIS:  Hyperparathyroidism.  POSTOPERATIVE DIAGNOSIS:  Hyperparathyroidism.  PROCEDURE:  Minimally invasive left superior parathyroidectomy.  SURGEON:  Ollen Gross. Vernell Morgans, M.D.  ASSISTANT:  Velora Heckler, M.D.  ANESTHESIA:  General endotracheal.  INDICATION FOR PROCEDURE:  This is a 77 year old white female who was found to have hyperparathyroidism and had a nuclear medicine scan that seemed to localize the parathyroid activity to a single site on the left neck.  The patient was taken earlier today for a nuclear medicine injection and is brought to the operating room now.  DESCRIPTION OF PROCEDURE:  After informed consent was obtained, the patient was brought to the operating room and placed in the supine position on the operating table.  After adequate induction of general anesthesia, the patient had a roll placed behind the shoulders for extension of the neck, and the patients neck was then prepped with Betadine and draped in the usual sterile manner.  The NeoProbe 2000 was used to localize the area of radioactivity in the neck, and a small incision was made overlying this area.  This incision was made with a 15 blade knife and was carried down through the skin and subcutaneous tissue using the Bovie electrocautery.  Electrocautery was used to divide the platysma muscle and get down to the anterior border of the strap muscles of the neck.  Flaps at this point were raised.  The platysmal flaps were raised superiorly and inferiorly with the Bovie electrocautery and blunt dissection.  The midline of the  neck was identified and was also incised with the Bovie electrocautery.  The strap muscles of the neck were then retracted laterally, and the thyroid gland was identified.  The thyroid gland was pulled medially, and blunt dissection was carried out laterally on the thyroid gland until the edge of the thyroid gland was encountered and blunt dissection was carried behind the thyroid gland until a soft mass was able to be seen as well as palpated, which had the appearance of a parathyroid adenoma.  Blunt dissection was carried out until this soft mass was able to be elevated somewhat.  The vascular supply to this parathyroid gland was located medially and was clipped with some small metal clips and divided with the Metzenbaum scissors.  Once this was done, the parathyroid gland was able to be freed from the surrounding tissue and removed.  Care was taken to avoid any neurovascular structure, and there were no nerves, specifically the recurrent nerve, that were encountered during this dissection.  The parathyroid measured 18 x 15 x 9 mm.  The radioactivity count of the parathyroid gland ex vivo was around 1100 counts, whereas the background of the thyroid was approximately 600-700 counts.  There were no apparent other hot spots identified.  The wound was irrigated and examined and found to be hemostatic.  The strap muscles were then reapproximated to the midline with interrupted 3-0 Vicryl stitches.  The platysma was reapproximated also using interrupted 3-0 Vicryl sutures,  and the skin incision was closed with interrupted 4-0 Monocryl subcuticular stitch. The wound was infiltrated with 0.25% Marcaine, benzoin and Steri-Strips and a sterile dressing were applied.  The patient tolerated the procedure well.  At the end of the case, all needle, sponge, and instrument counts were correct. The patient was awakened and taken to the recovery room in stable condition. Dictated by:   Ollen Gross. Vernell Morgans,  M.D. Attending Physician:  Caleen Essex DD:  01/05/02 TD:  01/06/02 Job: [id number]

## 2011-06-13 ENCOUNTER — Ambulatory Visit
Admission: RE | Admit: 2011-06-13 | Discharge: 2011-06-13 | Disposition: A | Discharge status: Home / Self Care | Payer: Medicare Other | Source: Ambulatory Visit | Attending: Nephrology | Admitting: Nephrology

## 2011-06-13 ENCOUNTER — Other Ambulatory Visit: Payer: Self-pay | Admitting: Nephrology

## 2011-06-13 DIAGNOSIS — R52 Pain, unspecified: Secondary | ICD-10-CM

## 2011-12-14 ENCOUNTER — Emergency Department (HOSPITAL_COMMUNITY): Payer: Medicare Other

## 2011-12-14 ENCOUNTER — Encounter: Payer: Self-pay | Admitting: Physical Medicine and Rehabilitation

## 2011-12-14 ENCOUNTER — Emergency Department (HOSPITAL_COMMUNITY)
Admission: EM | Admit: 2011-12-14 | Discharge: 2011-12-14 | Disposition: A | Discharge status: Other Acute Inpatient Hospital | Payer: Medicare Other | Attending: Emergency Medicine | Admitting: Emergency Medicine

## 2011-12-14 DIAGNOSIS — K219 Gastro-esophageal reflux disease without esophagitis: Secondary | ICD-10-CM | POA: Insufficient documentation

## 2011-12-14 DIAGNOSIS — I10 Essential (primary) hypertension: Secondary | ICD-10-CM | POA: Insufficient documentation

## 2011-12-14 DIAGNOSIS — R0609 Other forms of dyspnea: Secondary | ICD-10-CM | POA: Insufficient documentation

## 2011-12-14 DIAGNOSIS — L89309 Pressure ulcer of unspecified buttock, unspecified stage: Secondary | ICD-10-CM | POA: Insufficient documentation

## 2011-12-14 DIAGNOSIS — I4891 Unspecified atrial fibrillation: Secondary | ICD-10-CM | POA: Insufficient documentation

## 2011-12-14 DIAGNOSIS — L8991 Pressure ulcer of unspecified site, stage 1: Secondary | ICD-10-CM | POA: Insufficient documentation

## 2011-12-14 DIAGNOSIS — Z79899 Other long term (current) drug therapy: Secondary | ICD-10-CM | POA: Insufficient documentation

## 2011-12-14 DIAGNOSIS — F068 Other specified mental disorders due to known physiological condition: Secondary | ICD-10-CM | POA: Insufficient documentation

## 2011-12-14 DIAGNOSIS — R0789 Other chest pain: Secondary | ICD-10-CM | POA: Insufficient documentation

## 2011-12-14 DIAGNOSIS — I498 Other specified cardiac arrhythmias: Secondary | ICD-10-CM | POA: Insufficient documentation

## 2011-12-14 DIAGNOSIS — I2510 Atherosclerotic heart disease of native coronary artery without angina pectoris: Secondary | ICD-10-CM | POA: Insufficient documentation

## 2011-12-14 DIAGNOSIS — R609 Edema, unspecified: Secondary | ICD-10-CM | POA: Insufficient documentation

## 2011-12-14 DIAGNOSIS — R0989 Other specified symptoms and signs involving the circulatory and respiratory systems: Secondary | ICD-10-CM | POA: Insufficient documentation

## 2011-12-14 DIAGNOSIS — I509 Heart failure, unspecified: Secondary | ICD-10-CM | POA: Insufficient documentation

## 2011-12-14 DIAGNOSIS — R0602 Shortness of breath: Secondary | ICD-10-CM | POA: Insufficient documentation

## 2011-12-14 HISTORY — DX: Atherosclerotic heart disease of native coronary artery without angina pectoris: I25.10

## 2011-12-14 HISTORY — DX: Heart failure, unspecified: I50.9

## 2011-12-14 HISTORY — DX: Gastro-esophageal reflux disease without esophagitis: K21.9

## 2011-12-14 HISTORY — DX: Essential (primary) hypertension: I10

## 2011-12-14 HISTORY — DX: Unspecified dementia, unspecified severity, without behavioral disturbance, psychotic disturbance, mood disturbance, and anxiety: F03.90

## 2011-12-14 LAB — DIFFERENTIAL
Basophils Absolute: 0.1 10*3/uL (ref 0.0–0.1)
Basophils Relative: 1 % (ref 0–1)
Eosinophils Relative: 2 % (ref 0–5)
Lymphocytes Relative: 26 % (ref 12–46)
Monocytes Relative: 8 % (ref 3–12)
Neutro Abs: 6.4 10*3/uL (ref 1.7–7.7)

## 2011-12-14 LAB — POCT I-STAT TROPONIN I: Troponin i, poc: 0.01 ng/mL (ref 0.00–0.08)

## 2011-12-14 LAB — COMPREHENSIVE METABOLIC PANEL
AST: 25 U/L (ref 0–37)
Alkaline Phosphatase: 73 U/L (ref 39–117)
BUN: 44 mg/dL — ABNORMAL HIGH (ref 6–23)
Calcium: 10 mg/dL (ref 8.4–10.5)
GFR calc non Af Amer: 35 mL/min — ABNORMAL LOW (ref >90)
Glucose, Bld: 118 mg/dL — ABNORMAL HIGH (ref 70–99)
Total Bilirubin: 0.4 mg/dL (ref 0.3–1.2)

## 2011-12-14 LAB — CBC
HCT: 45.5 % (ref 36.0–46.0)
Hemoglobin: 15.5 g/dL — ABNORMAL HIGH (ref 12.0–15.0)
MCHC: 34.1 g/dL (ref 30.0–36.0)
MCV: 99.3 fL (ref 78.0–100.0)

## 2011-12-14 LAB — PROTIME-INR
INR: 1.09 (ref 0.00–1.49)
Prothrombin Time: 14.3 seconds (ref 11.6–15.2)

## 2011-12-14 LAB — D-DIMER, QUANTITATIVE: D-Dimer, Quant: 1.44 ug/mL-FEU — ABNORMAL HIGH (ref 0.00–0.48)

## 2011-12-14 LAB — APTT: aPTT: 34 seconds (ref 24–37)

## 2011-12-14 MED ORDER — ONDANSETRON HCL 4 MG/2ML IJ SOLN
INTRAMUSCULAR | Status: AC
  Administered 2011-12-14: 4 mg via INTRAVENOUS
  Filled 2011-12-14: qty 2

## 2011-12-14 MED ORDER — METOCLOPRAMIDE HCL 5 MG/ML IJ SOLN
10.0000 mg | Freq: Once | INTRAMUSCULAR | Status: AC
  Administered 2011-12-14: 10 mg via INTRAVENOUS
  Filled 2011-12-14: qty 2

## 2011-12-14 MED ORDER — ONDANSETRON HCL 4 MG/2ML IJ SOLN
4.0000 mg | Freq: Once | INTRAMUSCULAR | Status: AC
  Administered 2011-12-14: 4 mg via INTRAVENOUS

## 2011-12-14 MED ORDER — MORPHINE SULFATE 2 MG/ML IJ SOLN
2.0000 mg | Freq: Once | INTRAMUSCULAR | Status: AC
  Administered 2011-12-14: 2 mg via INTRAVENOUS
  Filled 2011-12-14: qty 1

## 2011-12-14 MED ORDER — ONDANSETRON HCL 4 MG/2ML IJ SOLN
4.0000 mg | Freq: Once | INTRAMUSCULAR | Status: AC
  Administered 2011-12-14: 4 mg via INTRAVENOUS
  Filled 2011-12-14: qty 2

## 2011-12-14 MED ORDER — IOHEXOL 300 MG/ML  SOLN
80.0000 mL | Freq: Once | INTRAMUSCULAR | Status: AC | PRN
  Administered 2011-12-14: 80 mL via INTRAVENOUS

## 2011-12-14 MED ORDER — ACETAMINOPHEN 325 MG PO TABS
650.0000 mg | ORAL_TABLET | Freq: Once | ORAL | Status: AC
  Administered 2011-12-14: 650 mg via ORAL
  Filled 2011-12-14: qty 2

## 2011-12-14 NOTE — ED Notes (Signed)
Received bedside report from Ed, Charity fundraiser.  Patient currently sitting up in bed; no respiratory or acute distress noted.  Family present at bedside.  Patient requesting to get out of bed for a while.  Recliner placed in patient's room, per patient request.  Introduced self to patient and updated whiteboard in room.  Patient has no other questions or concerns at this time.  Updated patient on plan of care; informed patient that she will be sent for a CT scan.  Will continue to monitor.

## 2011-12-14 NOTE — ED Notes (Signed)
Introduced self to patient after receiving report from Ed Charity fundraiser. Currently pt is sitting in bed with family at bedside. Pt is not in any respiratory or cardiac distress at this time. Pt states that she is not having anymore chest pain. She is complaining about her bottom hurting. Pt has a stage one on bottom (prior to coming to the ED). Pt was moved to recliner to assist with pain. Will continue to monitor.

## 2011-12-14 NOTE — ED Notes (Addendum)
Pt presents to department via [facility] for evaluation of L sided chest pain, SOB and atrial fibrillation. From [facility] facility. Onset today around lunch time. Denies pain at the time. Received 324 ASA per EMS. Pt states chronic back problems and CHF. She is conscious alert and oriented x4. No signs of distress at the present.

## 2011-12-14 NOTE — ED Notes (Signed)
Pt complaining of pain in bottom. Per MD, pain medication is not needed at this time. Will continue to monitor.

## 2011-12-14 NOTE — ED Notes (Signed)
Patient back from CT; currently sitting up in recliner.  Family present at bedside.  Patient requesting something to drink; patient given water (OKed by resident).  Patient has no other questions or concerns at this time.  Will continue to monitor.

## 2011-12-14 NOTE — ED Notes (Signed)
Changed patient's depends (diaper).

## 2011-12-14 NOTE — ED Provider Notes (Signed)
History     CSN: [id number]  Arrival date & time 12/14/11  1413   First MD Initiated Contact with Patient 12/14/11 1504      Chief Complaint  Patient presents with  . Atrial Fibrillation  . Chest Pain  . Shortness of Breath    (Consider location/radiation/quality/duration/timing/severity/associated sxs/prior treatment) HPI  Past Medical History  Diagnosis Date  . CHF (congestive heart failure)   . Dementia   . Hypertension   . Coronary artery disease   . GERD (gastroesophageal reflux disease)     History reviewed. No pertinent past surgical history.  History reviewed. No pertinent family history.  History  Substance Use Topics  . Smoking status: Never Smoker   . Smokeless tobacco: Not on file  . Alcohol Use: No    OB History    Grav Para Term Preterm Abortions TAB SAB Ect Mult Living                  Review of Systems  Allergies  Review of patient's allergies indicates no known allergies.  Home Medications   Current Outpatient Rx  Name Route Sig Dispense Refill  . ACETAMINOPHEN 650 MG PO TABS Oral Take 1 tablet by mouth every 6 (six) hours.      . AMLODIPINE BESYLATE 5 MG PO TABS Oral Take 5 mg by mouth daily.      Marland Kitchen VITAMIN D 1000 UNITS PO TABS Oral Take 1,000 Units by mouth daily.      . FUROSEMIDE 80 MG PO TABS Oral Take 80 mg by mouth 2 (two) times daily.      Marland Kitchen GLIPIZIDE 5 MG PO TABS Oral Take 2.5 mg by mouth daily.      Marland Kitchen LISINOPRIL 5 MG PO TABS Oral Take 5 mg by mouth daily.      . EUCERIN EX CREA Topical Apply 1 application topically 2 (two) times daily. To lower extremities       BP 150/114  Pulse 78  Temp(Src) 97.8 F (36.6 C) (Oral)  Resp 17  SpO2 98%  Physical Exam  ED Course  Procedures (including critical care time)  Labs Reviewed  COMPREHENSIVE METABOLIC PANEL - Abnormal; Notable for the following:    Glucose, Bld 118 (*)    BUN 44 (*)    Creatinine, Ser 1.34 (*)    GFR calc non Af Amer 35 (*)    GFR calc Af Amer 40 (*)     All other components within normal limits  CBC - Abnormal; Notable for the following:    Hemoglobin 15.5 (*)    All other components within normal limits  DIFFERENTIAL  APTT  PROTIME-INR  TROPONIN I   No results found.   No diagnosis found.    MDM  Level V caveat patient demented Complains of left anterior chest pain lasting 15 minutes today accompanied by shortness of breath still complains of mild shortness of breath presently complaining of pain in her box which he has chronically on exam chronically ill-appearing,. Lungs clear auscultation heart irregularly irregular abdomen nondistended obese nontender extremities without edema.red appearing erythematous and bilateral box no skin breakdown.        Doug Sou, MD 12/15/11 [phone]

## 2011-12-14 NOTE — ED Notes (Signed)
Patient and family given copy of discharge paperwork; went over discharge instructions with family and patient.  Patient instructed to follow up with cardiologist, especially about CT results and to have possible echocardiogram.  Instructed to return to the ED for new, worsening, or concerning symptoms.

## 2011-12-14 NOTE — ED Notes (Signed)
Patient transported to CT 

## 2011-12-14 NOTE — ED Notes (Signed)
Patient being wheeled to discharge desk with nurse tech.

## 2011-12-14 NOTE — ED Provider Notes (Signed)
History     CSN: [id number]  Arrival date & time 12/14/11  1413   First MD Initiated Contact with Patient 12/14/11 1504      Chief Complaint  Patient presents with  . Atrial Fibrillation  . Chest Pain  . Shortness of Breath    (Consider location/radiation/quality/duration/timing/severity/associated sxs/prior treatment) HPI  Level V caveat for dementia  History obtained from patient, family members, EMS report  Patient is an 77 year old female with a history of heart failure and A. fib who presents with chest pain. This started about 4 hours prior to arrival. It is located on the  left side of chest. It started while she was watching TV. It lasted less than 15 minutes. It did not change with movement or exertion. She had mild associated dyspnea. Symptoms resolved prior to arrival. Patient denies recent cough, fever, URI symptoms. She has history of heart failure but denies recent lower extremity swelling. Per her and her family her legs are at baseline. She denies any change in activity level over the last couple weeks. She still able to ambulate with a walker. Nothing was noted to make this chest discomfort better or worse. She denies nausea, vomiting, abdominal pain, diarrhea, trouble tolerating by mouth. Patient also complains of pain around her buttocks. She states this started while being transported by EMS. She states she has this problem whenever she sits on something other than her recliner. She has known minor pressure sore. Pain noted to be mild and gone on evaluation.   Past Medical History  Diagnosis Date  . CHF (congestive heart failure)   . Dementia   . Hypertension   . Coronary artery disease   . GERD (gastroesophageal reflux disease)     History reviewed. No pertinent past surgical history.  History reviewed. No pertinent family history.  History  Substance Use Topics  . Smoking status: Never Smoker   . Smokeless tobacco: Not on file  . Alcohol Use: No     OB History    Grav Para Term Preterm Abortions TAB SAB Ect Mult Living                  Review of Systems  Constitutional: Negative for fever and chills.  HENT: Negative for facial swelling.   Eyes: Negative for visual disturbance.  Respiratory: Negative for cough, chest tightness and wheezing.   Cardiovascular: Negative for chest pain.  Gastrointestinal: Negative for nausea, vomiting, abdominal pain and diarrhea.  Genitourinary: Negative for difficulty urinating.  Skin: Negative for rash.  Neurological: Negative for weakness and numbness.  Psychiatric/Behavioral: Negative for behavioral problems and confusion.  All other systems reviewed and are negative.    Allergies  Review of patient's allergies indicates no known allergies.  Home Medications   Current Outpatient Rx  Name Route Sig Dispense Refill  . ACETAMINOPHEN 650 MG PO TABS Oral Take 1 tablet by mouth every 6 (six) hours.      . AMLODIPINE BESYLATE 5 MG PO TABS Oral Take 5 mg by mouth daily.      Marland Kitchen VITAMIN D 1000 UNITS PO TABS Oral Take 1,000 Units by mouth daily.      . FUROSEMIDE 80 MG PO TABS Oral Take 80 mg by mouth 2 (two) times daily.      Marland Kitchen GLIPIZIDE 5 MG PO TABS Oral Take 2.5 mg by mouth daily.      Marland Kitchen LISINOPRIL 5 MG PO TABS Oral Take 5 mg by mouth daily.      Marland Kitchen  EUCERIN EX CREA Topical Apply 1 application topically 2 (two) times daily. To lower extremities       BP 130/58  Pulse 86  Temp(Src) 98.1 F (36.7 C) (Oral)  Resp 20  SpO2 99%  Physical Exam  Nursing note and vitals reviewed. Constitutional: She is oriented to person, place, and time. She appears well-developed and well-nourished. No distress.  HENT:  Head: Normocephalic.  Nose: Nose normal.  Eyes: EOM are normal.  Neck: Normal range of motion. Neck supple.  Cardiovascular: Normal rate and intact distal pulses.   No murmur heard.      Irregularly irregular  Pulmonary/Chest: Effort normal and breath sounds normal. No respiratory  distress. She has no wheezes. She exhibits no tenderness.  Abdominal: Soft. She exhibits no distension. There is no tenderness.  Genitourinary:       Stage I decubitus pressure ulcer. No drainage or evidence of infection. Mild tenderness to palpation.  Musculoskeletal: Normal range of motion. She exhibits no tenderness.       No calf TTP  Mild lower extremity pitting edema, this is stable  chronic swelling according to patient and family.  Neurological: She is alert and oriented to person, place, and time.       Normal strength  Skin: Skin is warm and dry. No rash noted. She is not diaphoretic.  Psychiatric: She has a normal mood and affect. Her behavior is normal. Thought content normal.    ED Course  Procedures (including critical care time)  ECG on 12/14/2011 at 1447: Heart rate 74. Atrial fibrillation. Normal axis. Normal QRS and QT interval. No hypertrophy. Nonspecific T wave changes in lateral leads. No significant ST depression or elevation. No acute ischemia. When compared to EKG from 04/03/2006, no significant change found.  Repeat ECG on 12/14/2011 at 1835. Heart rate 68. Atrial fibrillation. Normal axis. No hypertrophy. Normal intervals. Nonspecific T wave changes in lateral leads. No significant ST elevation or depression. No significant change when compared to EKG from earlier today.  Labs Reviewed  COMPREHENSIVE METABOLIC PANEL - Abnormal; Notable for the following:    Glucose, Bld 118 (*)    BUN 44 (*)    Creatinine, Ser 1.34 (*)    GFR calc non Af Amer 35 (*)    GFR calc Af Amer 40 (*)    All other components within normal limits  CBC - Abnormal; Notable for the following:    Hemoglobin 15.5 (*)    All other components within normal limits  D-DIMER, QUANTITATIVE - Abnormal; Notable for the following:    D-Dimer, Quant 1.44 (*)    All other components within normal limits  DIFFERENTIAL  APTT  PROTIME-INR  TROPONIN I  POCT I-STAT TROPONIN I  I-STAT TROPONIN I    Dg Chest 2 View  12/14/2011  *RADIOLOGY REPORT*  Clinical Data: Chest pain.  CHEST - 2 VIEW  Comparison: 04/03/2006  Findings: Cardiomegaly noted. The patient is rotated to the left on today's exam, resulting in reduced diagnostic sensitivity and specificity.  There is obscuration of both hemidiaphragms although I suspect that this may be related the patient positioning given that no lower lobe airspace opacity is visible on the lateral projection. Thoracolumbar kyphosis noted without visible compression fracture.  Atherosclerotic calcification of the aortic arch is present.  I cannot exclude pulmonary venous hypertension.  IMPRESSION:  1.  Cardiomegaly, potentially with pulmonary venous hypertension. 2.  Obscuration of the hemidiaphragms is thought to be positional given the lack of lower lobe airspace  opacity on the lateral projection.  Original Report Authenticated By: Dellia Cloud, M.D.   Ct Angio Chest W/cm &/or Wo Cm  12/14/2011  *RADIOLOGY REPORT*  Clinical Data:  Chest pain, shortness of breath, history of CHF  CT ANGIOGRAPHY CHEST WITH CONTRAST  Technique:  Multidetector CT imaging of the chest was performed using the standard protocol during bolus administration of intravenous contrast.  Multiplanar CT image reconstructions including MIPs were obtained to evaluate the vascular anatomy.  Contrast: 80mL OMNIPAQUE IOHEXOL 300 MG/ML IV SOLN  Comparison:  CT abdomen 12/23/2006  Findings:  The study is of excellent technical quality.  No pulmonary embolus is noted.  Atherosclerotic calcifications of the thoracic aorta and coronary arteries are noted.  No aortic aneurysm.  There is cardiomegaly noted.  In axial image 60 and 61 there is a contour bulge of the cardiac apex with subtle peripheral enhancement.  Most likely this is due to prior infarct in this region.  Less likely a left ventricular aneurysm.  There is some low density material in axial image 61 in the anterior aspect of the left  ventricle.  Intraventricular thrombus cannot be entirely excluded.  Correlation with cardiac echo exam  is recommended.  No mediastinal hematoma or adenopathy.  No pericardial effusion is noted.  The visualized upper abdomen is unremarkable.  Central airways are patent.  Images of the lung parenchyma shows no acute infiltrate or pulmonary edema.  Central mild vascular congestion is noted.  No focal consolidation.  Sagittal images of the spine shows osteopenia and degenerative changes thoracic spine. Mild compression deformity T11 vertebral body is stable from 2008.  Clinical correlation is necessary.  Review of the MIP images confirms the above findings.  IMPRESSION:  1. No pulmonary embolus is noted. 2. There is cardiomegaly noted.  In axial image 60 and 61 there is a contour bulge of the cardiac apex with subtle peripheral enhancement.  Most likely this is due to prior infarct in this region.  Less likely a left ventricular aneurysm.  There is some low density material in axial image 61 in the anterior aspect of the left ventricle.  Intraventricular thrombus cannot be entirely excluded.  Correlation with cardiac echo exam  is recommended. 3.  No acute infiltrate or pulmonary edema. 4.  Atherosclerotic calcifications of the thoracic aorta and coronary arteries.  Original Report Authenticated By: Natasha Mead, M.D.     1. Atypical chest pain       MDM   Patient with history of heart failure presented with atypical chest pain that  was short-lived and resolved prior to arrival. Patient with mild lower extremity edema on exam but this is similar to her baseline according to her family. She otherwise appears euvolemic. No respiratory distress on exam and has good air movement. Her room air saturations are 98%. Initial EKG shows her chronic atrial fibrillation without evidence of acute ischemia. Initial troponin is negative. With sedentary lifestyle and nature of pain, evaluated for PE. CTA performed and  negative for PE. There is incidental note of altered filling an area of LV which would make it impossible to exclude ventricle thrombus. Patient is not on anticoagulation and has not been on that for years. She has not had any strokelike symptoms. I discussed patient's initial presentation in plan from a cardiac standpoint with her primary cardiologist at [facility]. From the workup we had and the clinical picture he was in agreement with plan for serial troponins and outpatient followup. There  is no indication that this is heart failure exacerbation her significant ischemic event. After the discussion with him, CTA results came back. With the note of the contrast filling change in ventricle, I briefly discussed with on-call cardiologist. He recommended a outpatient echo to further evaluate. I discussed the case with patient and her family.  They are in agreement with plan to followup in outpatient setting. We discussed reasons to return. At time of discharge, patient is well-appearing, without respiratory distress and with normal room air saturations. She is mentating well and tolerating by mouth.  While patient is an emergency department she complained of pain in her bottom. This is a chronic issue related to her sitting in certain positions and with mild stage I pressure ulcer. Patient was given small dose of morphine to help with his symptoms. A little while after that she had severe nausea. She also had few episodes of vomiting. This nausea is adequately controlled with Zofran and Reglan. She did not have any chest pain or dyspnea with this. A repeat EKG during this time was obtained and negative for acute ischemic changes. It appears that this nausea is secondary to medication side effect. Once patient was moved from stretcher to recliner she noted instant pain he is relief in her bottom pain. Her primary care doctor will followup for this.       Milus Glazier 12/14/11 2351

## 2011-12-15 NOTE — ED Provider Notes (Signed)
I have personally seen and examined the patient.  I have discussed the plan of care with the resident.  I have reviewed the documentation on PMH/FH/Soc. History.  I have reviewed the documentation of the resident and agree.  Doug Sou, MD 12/15/11 [phone]

## 2012-03-12 ENCOUNTER — Emergency Department (HOSPITAL_COMMUNITY): Payer: Medicare Other

## 2012-03-12 ENCOUNTER — Encounter (HOSPITAL_COMMUNITY): Payer: Self-pay

## 2012-03-12 ENCOUNTER — Emergency Department (HOSPITAL_COMMUNITY)
Admission: EM | Admit: 2012-03-12 | Discharge: 2012-03-12 | Disposition: A | Discharge status: Home / Self Care | Payer: Medicare Other | Attending: Emergency Medicine | Admitting: Emergency Medicine

## 2012-03-12 DIAGNOSIS — M1710 Unilateral primary osteoarthritis, unspecified knee: Secondary | ICD-10-CM | POA: Insufficient documentation

## 2012-03-12 DIAGNOSIS — F0390 Unspecified dementia without behavioral disturbance: Secondary | ICD-10-CM | POA: Insufficient documentation

## 2012-03-12 DIAGNOSIS — I10 Essential (primary) hypertension: Secondary | ICD-10-CM | POA: Insufficient documentation

## 2012-03-12 DIAGNOSIS — M25561 Pain in right knee: Secondary | ICD-10-CM

## 2012-03-12 DIAGNOSIS — I2510 Atherosclerotic heart disease of native coronary artery without angina pectoris: Secondary | ICD-10-CM | POA: Insufficient documentation

## 2012-03-12 DIAGNOSIS — I509 Heart failure, unspecified: Secondary | ICD-10-CM | POA: Insufficient documentation

## 2012-03-12 DIAGNOSIS — K219 Gastro-esophageal reflux disease without esophagitis: Secondary | ICD-10-CM | POA: Insufficient documentation

## 2012-03-12 DIAGNOSIS — M25569 Pain in unspecified knee: Secondary | ICD-10-CM | POA: Insufficient documentation

## 2012-03-12 LAB — CBC
Hemoglobin: 15.4 g/dL — ABNORMAL HIGH (ref 12.0–15.0)
MCH: 32.7 pg (ref 26.0–34.0)
MCHC: 33.4 g/dL (ref 30.0–36.0)
MCV: 97.9 fL (ref 78.0–100.0)
Platelets: 200 10*3/uL (ref 150–400)

## 2012-03-12 LAB — POCT I-STAT, CHEM 8
BUN: 50 mg/dL — ABNORMAL HIGH (ref 6–23)
Chloride: 109 mEq/L (ref 96–112)
HCT: 48 % — ABNORMAL HIGH (ref 36.0–46.0)
Potassium: 3.9 mEq/L (ref 3.5–5.1)
Sodium: 142 mEq/L (ref 135–145)

## 2012-03-12 MED ORDER — ONDANSETRON HCL 4 MG/2ML IJ SOLN
INTRAMUSCULAR | Status: AC
  Administered 2012-03-12: 2 mg via INTRAVENOUS
  Filled 2012-03-12: qty 2

## 2012-03-12 MED ORDER — FENTANYL CITRATE 0.05 MG/ML IJ SOLN
25.0000 ug | Freq: Once | INTRAMUSCULAR | Status: AC
  Administered 2012-03-12: 25 ug via INTRAVENOUS

## 2012-03-12 MED ORDER — HYDROCODONE-ACETAMINOPHEN 5-500 MG PO TABS: 1.0000 | ORAL_TABLET | Freq: Four times a day (QID) | ORAL | Status: AC | PRN

## 2012-03-12 MED ORDER — ONDANSETRON HCL 4 MG/2ML IJ SOLN
4.0000 mg | Freq: Once | INTRAMUSCULAR | Status: AC
  Administered 2012-03-12: 2 mg via INTRAVENOUS

## 2012-03-12 MED ORDER — FENTANYL CITRATE 0.05 MG/ML IJ SOLN
25.0000 ug | Freq: Once | INTRAMUSCULAR | Status: AC
  Administered 2012-03-12: 25 ug via INTRAVENOUS
  Filled 2012-03-12 (×2): qty 2

## 2012-03-12 MED ORDER — HYDROCODONE-ACETAMINOPHEN 5-325 MG PO TABS: 1.0000 | ORAL_TABLET | Freq: Once | ORAL | Status: DC

## 2012-03-12 MED ORDER — FENTANYL CITRATE 0.05 MG/ML IJ SOLN
INTRAMUSCULAR | Status: AC
  Administered 2012-03-12: 25 ug via INTRAVENOUS
  Filled 2012-03-12: qty 2

## 2012-03-12 NOTE — ED Notes (Signed)
Patient transported to X-ray 

## 2012-03-12 NOTE — ED Provider Notes (Signed)
History     CSN: [id number]  Arrival date & time 03/12/12  0127   First MD Initiated Contact with Patient 03/12/12 0127      Chief Complaint  Patient presents with  . Knee Pain    c/o pain to LLE - more localized to knee area; states pain present "for a long time"; however, tonight pain becamse worse    (Consider location/radiation/quality/duration/timing/severity/associated sxs/prior treatment) Patient is a 78 y.o. female presenting with knee pain. The history is provided by the patient and the EMS personnel.  Knee Pain This is a recurrent problem. The current episode started 6 to 12 hours ago. The problem occurs constantly. The problem has been gradually worsening. Pertinent negatives include no chest pain, no abdominal pain, no headaches and no shortness of breath. The symptoms are aggravated by walking. The symptoms are relieved by lying down. She has tried nothing for the symptoms.   Resides in an assisted living facility and uses a walker to get around. The most part she stays in her recliner. She has known arthritis in her right knee and tonight having worsening right knee pains. No fevers or chills. No swelling. No falls or trauma. No hip pain. Sharp in quality nonradiating. No posterior knee pain. No calf pain. She has some back pain. She has not been taking any medications for this. No chest pain or shortness of breath.  Past Medical History  Diagnosis Date  . CHF (congestive heart failure)   . Dementia   . Hypertension   . Coronary artery disease   . GERD (gastroesophageal reflux disease)     History reviewed. No pertinent past surgical history.  No family history on file.  History  Substance Use Topics  . Smoking status: Never Smoker   . Smokeless tobacco: Not on file  . Alcohol Use: No    OB History    Grav Para Term Preterm Abortions TAB SAB Ect Mult Living                  Review of Systems  Constitutional: Negative for fever and chills.  HENT:  Negative for neck pain and neck stiffness.   Eyes: Negative for pain.  Respiratory: Negative for shortness of breath.   Cardiovascular: Negative for chest pain.  Gastrointestinal: Negative for abdominal pain.  Genitourinary: Negative for dysuria.  Musculoskeletal: Negative for joint swelling.  Skin: Negative for rash and wound.  Neurological: Negative for headaches.  All other systems reviewed and are negative.    Allergies  Review of patient's allergies indicates no known allergies.  Home Medications   Current Outpatient Rx  Name Route Sig Dispense Refill  . ACETAMINOPHEN ER 650 MG PO TBCR Oral Take 650 mg by mouth every 8 (eight) hours as needed. arthritis    . AMLODIPINE BESYLATE 5 MG PO TABS Oral Take 5 mg by mouth daily.      . ASPIRIN 81 MG PO CHEW Oral Chew 81 mg by mouth daily.    Marland Kitchen VITAMIN D 1000 UNITS PO TABS Oral Take 1,000 Units by mouth daily.      . FUROSEMIDE 80 MG PO TABS Oral Take 160 mg by mouth 2 (two) times daily.     Marland Kitchen GLIPIZIDE 5 MG PO TABS Oral Take 2.5 mg by mouth daily.      . ADULT MULTIVITAMIN W/MINERALS CH Oral Take 1 tablet by mouth daily.    Marland Kitchen MUPIROCIN 2 % EX OINT Topical Apply 1 application topically daily. Apply to open  area on lower extremity and cover with bandaid    . OMEPRAZOLE 20 MG PO CPDR Oral Take 20 mg by mouth daily.    . SENNA-DOCUSATE SODIUM 8.6-50 MG PO TABS Oral Take 1 tablet by mouth 2 (two) times daily.    . EUCERIN EX CREA Topical Apply 1 application topically 2 (two) times daily. To lower extremities     . TRAZODONE HCL 100 MG PO TABS Oral Take 100 mg by mouth at bedtime.    Marland Kitchen DOXYCYCLINE HYCLATE 100 MG PO TABS Oral Take 100 mg by mouth 2 (two) times daily. For 10 days 02/27/12 to 03/08/12      BP 137/46  Temp(Src) 97.8 F (36.6 C) (Oral)  Resp 20  SpO2 97%  Physical Exam  Constitutional: She is oriented to person, place, and time. She appears well-developed and well-nourished.  HENT:  Head: Normocephalic and atraumatic.    Eyes: Conjunctivae and EOM are normal. Pupils are equal, round, and reactive to light.  Neck: Trachea normal. Neck supple. No thyromegaly present.  Cardiovascular: Normal rate, regular rhythm, S1 normal, S2 normal and normal pulses.     No systolic murmur is present   No diastolic murmur is present  Pulses:      Radial pulses are 2+ on the right side, and 2+ on the left side.  Pulmonary/Chest: Effort normal and breath sounds normal. She has no wheezes. She has no rhonchi. She has no rales. She exhibits no tenderness.  Abdominal: Soft. Normal appearance and bowel sounds are normal. There is no tenderness. There is no CVA tenderness and negative Murphy's sign.  Musculoskeletal:       Right knee mildly tender and patient localizes tenderness to anterior aspect of knee. Mild effusion. Limited range of motion secondary to pain. No posterior tenderness or fullness. Distal neurovascular intact. Nontender hip and foot and ankle. No increased warmth or erythema.  BLE:s Calves nontender, no cords or erythema, negative Homans sign  Neurological: She is alert and oriented to person, place, and time. She has normal strength. No cranial nerve deficit or sensory deficit. GCS eye subscore is 4. GCS verbal subscore is 5. GCS motor subscore is 6.       hard of hearing  Skin: Skin is warm and dry. No rash noted. She is not diaphoretic.  Psychiatric: Her speech is normal.       Cooperative and appropriate    ED Course  Procedures (including critical care time)  Labs Reviewed  CBC - Abnormal; Notable for the following:    Hemoglobin 15.4 (*)    HCT 46.1 (*)    All other components within normal limits  POCT I-STAT, CHEM 8 - Abnormal; Notable for the following:    BUN 50 (*)    Creatinine, Ser 1.50 (*)    Glucose, Bld 117 (*)    Hemoglobin 16.3 (*)    HCT 48.0 (*)    All other components within normal limits   Dg Knee Complete 4 Views Right  03/12/2012  *RADIOLOGY REPORT*  Clinical Data: Chronic  right knee pain.  RIGHT KNEE - COMPLETE 4+ VIEW  Comparison: Right knee radiographs performed 06/13/2011  Findings: There is no evidence of fracture or dislocation.  Mild medial compartment narrowing is noted; marginal osteophytes are noted at the medial compartment and tibial spine.  No significant degenerative change is seen; the patellofemoral joint is grossly unremarkable in appearance.  No significant joint effusion is seen.  The visualized soft tissues are otherwise unremarkable  in appearance.  IMPRESSION:  1.  No evidence of fracture or dislocation. 2.  Small knee joint effusion noted. 3.  Mild medial compartment osteoarthritis noted.  Original Report Authenticated By: Tonia Ghent, M.D.    Pain medications provided and symptoms significantly improved.    MDM   Right knee pain likely arthritis. No clinical evidence of DVT. No cellulitis or evidence of infection. Given improved condition plan discharge home with orthopedic referral and osteoarthritis instructions. Son is bedside agrees to discharge and followup instructions. Patient has taken hydrocodone in the recent past for other pains - will prescribe the same with narcotic medication precautions.        Sunnie Nielsen, MD 03/12/12 [phone]

## 2012-03-12 NOTE — ED Notes (Signed)
[facility] called to transport patient to [facility].

## 2012-03-12 NOTE — ED Notes (Addendum)
Patient cleaned up and placed in brief and place in blue scrub pants and patient's top to transport to [facility] at [facility].

## 2012-03-12 NOTE — Discharge Instructions (Signed)
Knee Pain The knee is the complex joint between your thigh and your lower leg. It is made up of bones, tendons, ligaments, and cartilage. The bones that make up the knee are:  The femur in the thigh.   The tibia and fibula in the lower leg.   The patella or kneecap riding in the groove on the lower femur.  CAUSES  Knee pain is a common complaint with many causes. A few of these causes are:  Injury, such as:   A ruptured ligament or tendon injury.   Torn cartilage.   Medical conditions, such as:   Gout   Arthritis   Infections   Overuse, over training or overdoing a physical activity.  Knee pain can be minor or severe. Knee pain can accompany debilitating injury. Minor knee problems often respond well to self-care measures or get well on their own. More serious injuries may need medical intervention or even surgery. SYMPTOMS The knee is complex. Symptoms of knee problems can vary widely. Some of the problems are:  Pain with movement and weight bearing.   Swelling and tenderness.   Buckling of the knee.   Inability to straighten or extend your knee.   Your knee locks and you cannot straighten it.   Warmth and redness with pain and fever.   Deformity or dislocation of the kneecap.  DIAGNOSIS  Determining what is wrong may be very straight forward such as when there is an injury. It can also be challenging because of the complexity of the knee. Tests to make a diagnosis may include:  Your caregiver taking a history and doing a physical exam.   Routine X-rays can be used to rule out other problems. X-rays will not reveal a cartilage tear. Some injuries of the knee can be diagnosed by:   Arthroscopy a surgical technique by which a small video camera is inserted through tiny incisions on the sides of the knee. This procedure is used to examine and repair internal knee joint problems. Tiny instruments can be used during arthroscopy to repair the torn knee cartilage  (meniscus).   Arthrography is a radiology technique. A contrast liquid is directly injected into the knee joint. Internal structures of the knee joint then become visible on X-ray film.   An MRI scan is a non x-ray radiology procedure in which magnetic fields and a computer produce two- or three-dimensional images of the inside of the knee. Cartilage tears are often visible using an MRI scanner. MRI scans have largely replaced arthrography in diagnosing cartilage tears of the knee.   Blood work.   Examination of the fluid that helps to lubricate the knee joint (synovial fluid). This is done by taking a sample out using a needle and a syringe.  TREATMENT The treatment of knee problems depends on the cause. Some of these treatments are:  Rest, keep your leg elevated and apply ice for comfort.   Depending on the injury, proper casting, splinting, surgery or physical therapy care will be needed.   Give yourself adequate recovery time. Do not overuse your joints. If you begin to get sore during workout routines, back off. Slow down or do fewer repetitions.   For repetitive activities such as cycling or running, maintain your strength and nutrition.   Alternate muscle groups. For example if you are a weight lifter, work the upper body on one day and the lower body the next.   Either tight or weak muscles do not give the proper support for  your knee. Tight or weak muscles do not absorb the stress placed on the knee joint. Keep the muscles surrounding the knee strong.   Take care of mechanical problems.   If you have flat feet, orthotics or special shoes may help. See your caregiver if you need help.   Arch supports, sometimes with wedges on the inner or outer aspect of the heel, can help. These can shift pressure away from the side of the knee most bothered by osteoarthritis.   A brace called an "unloader" brace also may be used to help ease the pressure on the most arthritic side of the knee.    If your caregiver has prescribed crutches, braces, wraps or ice, use as directed. The acronym for this is PRICE. This means protection, rest, ice, compression and elevation.   Nonsteroidal anti-inflammatory drugs (NSAID's), can help relieve pain. But if taken immediately after an injury, they may actually increase swelling. Take NSAID's with food in your stomach. Stop them if you develop stomach problems. Do not take these if you have a history of ulcers, stomach pain or bleeding from the bowel. Do not take without your caregiver's approval if you have problems with fluid retention, heart failure, or kidney problems.   For ongoing knee problems, physical therapy may be helpful.   Glucosamine and chondroitin are over-the-counter dietary supplements. Both may help relieve the pain of osteoarthritis in the knee. These medicines are different from the usual anti-inflammatory drugs. Glucosamine may decrease the rate of cartilage destruction.   Injections of a corticosteroid drug into your knee joint may help reduce the symptoms of an arthritis flare-up. They may provide pain relief that lasts a few months. You may have to wait a few months between injections. The injections do have a small increased risk of infection, water retention and elevated blood sugar levels.   Hyaluronic acid injected into damaged joints may ease pain and provide lubrication. These injections may work by reducing inflammation. A series of shots may give relief for as long as 6 months.   Topical painkillers. Applying certain ointments to your skin may help relieve the pain and stiffness of osteoarthritis. Ask your pharmacist for suggestions. Many over the-counter products are approved for temporary relief of arthritis pain.   In some countries, doctors often prescribe topical NSAID's for relief of chronic conditions such as arthritis and tendinitis. A review of treatment with NSAID creams found that they worked as well as oral  medications but without the serious side effects.  PREVENTION  Maintain a healthy weight. Extra pounds put more strain on your joints.   Get strong, stay limber. Weak muscles are a common cause of knee injuries. Stretching is important. Include flexibility exercises in your workouts.   Be smart about exercise. If you have osteoarthritis, chronic knee pain or recurring injuries, you may need to change the way you exercise. This does not mean you have to stop being active. If your knees ache after jogging or playing basketball, consider switching to swimming, water aerobics or other low-impact activities, at least for a few days a week. Sometimes limiting high-impact activities will provide relief.   Make sure your shoes fit well. Choose footwear that is right for your sport.   Protect your knees. Use the proper gear for knee-sensitive activities. Use kneepads when playing volleyball or laying carpet. Buckle your seat belt every time you drive. Most shattered kneecaps occur in car accidents.   Rest when you are tired.  SEEK MEDICAL CARE IF:  You have knee pain that is continual and does not seem to be getting better.  SEEK IMMEDIATE MEDICAL CARE IF:  Your knee joint feels hot to the touch and you have a high fever. MAKE SURE YOU:   Understand these instructions.   Will watch your condition.   Will get help right away if you are not doing well or get worse.  Document Released: 09/29/2007 Document Revised: 11/21/2011 Document Reviewed: 09/29/2007 [facility] Patient Information 2012 [place], [place].

## 2012-03-12 NOTE — ED Notes (Signed)
Signature Pad not working at time of discharge. 

## 2012-03-12 NOTE — ED Notes (Addendum)
C/o RLL pain in the calf area. Redness hot to the touch

## 2012-03-12 NOTE — ED Notes (Signed)
First meeting patient. Patient states she is having pain in her back and bilateral legs. Family at bedside.

## 2012-03-12 NOTE — ED Notes (Signed)
PTAR at bedside to transport patient to [facility].

## 2012-06-10 ENCOUNTER — Emergency Department (HOSPITAL_COMMUNITY)
Admission: EM | Admit: 2012-06-10 | Discharge: 2012-06-10 | Disposition: A | Discharge status: Home / Self Care | Payer: Medicare Other | Attending: Emergency Medicine | Admitting: Emergency Medicine

## 2012-06-10 ENCOUNTER — Emergency Department (HOSPITAL_COMMUNITY): Payer: Medicare Other

## 2012-06-10 ENCOUNTER — Encounter (HOSPITAL_COMMUNITY): Payer: Self-pay

## 2012-06-10 DIAGNOSIS — I10 Essential (primary) hypertension: Secondary | ICD-10-CM | POA: Insufficient documentation

## 2012-06-10 DIAGNOSIS — L89309 Pressure ulcer of unspecified buttock, unspecified stage: Secondary | ICD-10-CM

## 2012-06-10 DIAGNOSIS — I2510 Atherosclerotic heart disease of native coronary artery without angina pectoris: Secondary | ICD-10-CM | POA: Insufficient documentation

## 2012-06-10 DIAGNOSIS — K5730 Diverticulosis of large intestine without perforation or abscess without bleeding: Secondary | ICD-10-CM | POA: Insufficient documentation

## 2012-06-10 DIAGNOSIS — R109 Unspecified abdominal pain: Secondary | ICD-10-CM | POA: Insufficient documentation

## 2012-06-10 DIAGNOSIS — F0390 Unspecified dementia without behavioral disturbance: Secondary | ICD-10-CM | POA: Insufficient documentation

## 2012-06-10 DIAGNOSIS — L8990 Pressure ulcer of unspecified site, unspecified stage: Secondary | ICD-10-CM | POA: Insufficient documentation

## 2012-06-10 DIAGNOSIS — M545 Low back pain, unspecified: Secondary | ICD-10-CM | POA: Insufficient documentation

## 2012-06-10 LAB — URINALYSIS, ROUTINE W REFLEX MICROSCOPIC
Bilirubin Urine: NEGATIVE
Glucose, UA: NEGATIVE mg/dL
Ketones, ur: NEGATIVE mg/dL
Leukocytes, UA: NEGATIVE
Protein, ur: NEGATIVE mg/dL
pH: 6.5 (ref 5.0–8.0)

## 2012-06-10 LAB — COMPREHENSIVE METABOLIC PANEL
AST: 19 U/L (ref 0–37)
Alkaline Phosphatase: 66 U/L (ref 39–117)
BUN: 29 mg/dL — ABNORMAL HIGH (ref 6–23)
CO2: 25 mEq/L (ref 19–32)
Chloride: 104 mEq/L (ref 96–112)
Creatinine, Ser: 1.56 mg/dL — ABNORMAL HIGH (ref 0.50–1.10)
GFR calc non Af Amer: 29 mL/min — ABNORMAL LOW (ref >90)
Potassium: 3.1 mEq/L — ABNORMAL LOW (ref 3.5–5.1)
Total Bilirubin: 0.3 mg/dL (ref 0.3–1.2)

## 2012-06-10 LAB — CBC
HCT: 43.8 % (ref 36.0–46.0)
MCV: 95.2 fL (ref 78.0–100.0)
RBC: 4.6 MIL/uL (ref 3.87–5.11)
WBC: 9 10*3/uL (ref 4.0–10.5)

## 2012-06-10 LAB — LIPASE, BLOOD: Lipase: 47 U/L (ref 11–59)

## 2012-06-10 MED ORDER — MORPHINE SULFATE 4 MG/ML IJ SOLN
4.0000 mg | Freq: Once | INTRAMUSCULAR | Status: AC
  Administered 2012-06-10: 4 mg via INTRAVENOUS
  Filled 2012-06-10: qty 1

## 2012-06-10 MED ORDER — ONDANSETRON HCL 4 MG/2ML IJ SOLN
4.0000 mg | Freq: Once | INTRAMUSCULAR | Status: AC
  Administered 2012-06-10: 4 mg via INTRAVENOUS
  Filled 2012-06-10: qty 2

## 2012-06-10 MED ORDER — IOHEXOL 300 MG/ML  SOLN
20.0000 mL | INTRAMUSCULAR | Status: AC
  Administered 2012-06-10: 20 mL via ORAL

## 2012-06-10 MED ORDER — POTASSIUM CHLORIDE CRYS ER 20 MEQ PO TBCR
40.0000 meq | EXTENDED_RELEASE_TABLET | Freq: Once | ORAL | Status: AC
  Administered 2012-06-10: 40 meq via ORAL
  Filled 2012-06-10: qty 2

## 2012-06-10 MED ORDER — IOHEXOL 300 MG/ML  SOLN
80.0000 mL | Freq: Once | INTRAMUSCULAR | Status: AC | PRN
  Administered 2012-06-10: 80 mL via INTRAVENOUS

## 2012-06-10 NOTE — ED Notes (Signed)
Spoke with pt.s son updated him on pt.s status.  He will not be able to come up, but will keep him informed by phone.  Pt. Will have to go back to facility by ambulance

## 2012-06-10 NOTE — ED Provider Notes (Signed)
History     CSN: [id number]  Arrival date & time 06/10/12  0845   First MD Initiated Contact with Patient 06/10/12 0848      Chief Complaint  Patient presents with  . Abdominal Pain    (Consider location/radiation/quality/duration/timing/severity/associated sxs/prior treatment) HPI Pt presents with c/o abdominal pain.  Pt states pain is in center of abdomen, described as sharp and aching.  No vomiting or diarrhea.  No fever/chills.  Pain has been ongoing for several days.  Nursing home staff reports abdomen is distended.  Pt also has pain around rectum- she is noted to have a decubitus ulceration causing her pain in that region.  Movement and palpation makes pain worse, there are no other alleviating or modifying factors, there are no other associated systemic symptoms  Past Medical History  Diagnosis Date  . CHF (congestive heart failure)   . Dementia   . Hypertension   . Coronary artery disease   . GERD (gastroesophageal reflux disease)     History reviewed. No pertinent past surgical history.  No family history on file.  History  Substance Use Topics  . Smoking status: Never Smoker   . Smokeless tobacco: Not on file  . Alcohol Use: No    OB History    Grav Para Term Preterm Abortions TAB SAB Ect Mult Living                  Review of Systems ROS reviewed and all otherwise negative except for mentioned in HPI  Allergies  Morphine and related  Home Medications   Current Outpatient Rx  Name Route Sig Dispense Refill  . ACETAMINOPHEN ER 650 MG PO TBCR Oral Take 650 mg by mouth 3 (three) times daily. arthritis    . AMLODIPINE BESYLATE 5 MG PO TABS Oral Take 5 mg by mouth daily.      . ASPIRIN 81 MG PO CHEW Oral Chew 81 mg by mouth daily.    Marland Kitchen VITAMIN D 1000 UNITS PO TABS Oral Take 1,000 Units by mouth daily.      Marland Kitchen DOCUSATE SODIUM 100 MG PO CAPS Oral Take 100 mg by mouth at bedtime.    . FUROSEMIDE 80 MG PO TABS Oral Take 160 mg by mouth 2 (two) times daily.      Marland Kitchen GLIPIZIDE 5 MG PO TABS Oral Take 2.5 mg by mouth daily.      Marland Kitchen HYDROCODONE-ACETAMINOPHEN 5-500 MG PO TABS Oral Take 1 tablet by mouth every 6 (six) hours as needed. For pain    . LEVOTHYROXINE SODIUM 25 MCG PO TABS Oral Take 12.5 mcg by mouth daily.    Marland Kitchen LOPERAMIDE HCL 2 MG PO CAPS Oral Take 2 mg by mouth once as needed. For diarrhea    . ADULT MULTIVITAMIN W/MINERALS CH Oral Take 1 tablet by mouth daily.    Marland Kitchen OMEPRAZOLE 20 MG PO CPDR Oral Take 20 mg by mouth daily.    Marland Kitchen OVER THE COUNTER MEDICATION Oral Take 500 mg by mouth at bedtime. Psyllium 500 mg capsule at bedtime    . SENNA-DOCUSATE SODIUM 8.6-50 MG PO TABS Oral Take 1 tablet by mouth 2 (two) times daily.    . EUCERIN EX CREA Topical Apply 1 application topically 2 (two) times daily. To lower extremities     . TRAZODONE HCL 100 MG PO TABS Oral Take 100 mg by mouth at bedtime.      BP 103/81  Pulse 64  Temp 97.6 F (36.4 C) (Oral)  Resp 15  SpO2 95% Vitals reviewed Physical Exam Physical Examination: General appearance - alert, well appearing, and in no distress Mental status - alert, oriented to person, place, and time Eyes - pupils equal and reactive, extraocular eye movements intact Mouth - mucous membranes moist, pharynx normal without lesions Chest - clear to auscultation, no wheezes, rales or rhonchi, symmetric air entry Heart - normal rate, regular rhythm, normal S1, S2, no murmurs, rubs, clicks or gallops Abdomen - soft, diffusely tender, no gaurding and no rebound, nondistended, no masses or organomegaly, nabs Extremities - peripheral pulses normal, no pedal edema, no clubbing or cyanosis Skin - normal coloration and turgor, no rashes  ED Course  Procedures (including critical care time)  Labs Reviewed  COMPREHENSIVE METABOLIC PANEL - Abnormal; Notable for the following:    Potassium 3.1 (*)     Glucose, Bld 112 (*)     BUN 29 (*)     Creatinine, Ser 1.56 (*)     GFR calc non Af Amer 29 (*)     GFR calc Af  Amer 33 (*)     All other components within normal limits  CBC  LIPASE, BLOOD  URINALYSIS, ROUTINE W REFLEX MICROSCOPIC  URINE CULTURE  LAB REPORT - SCANNED   No results found.   1. Abdominal pain   2. Diverticulosis of colon without diverticulitis   3. Decubitus ulcer of buttock       MDM  Pt presents with c/o abdominal pain over the past several days.  She does have a decubitus ulcer on her sacrum which is causing her pain on sitting.  Labs obtained as well as CT scan.          Ethelda Chick, MD 06/13/12 1758

## 2012-06-10 NOTE — Discharge Instructions (Signed)
Read the information below.  Your CT scan showed diverticulosis but no signs of infection and no cause for your pain.  Your urine has been sent for culture and you will be called and an antibiotic ordered if you have an infection.  If you develop fevers, worsening pain, vomiting, are unable to have a bowel movement or pass gas, or are unable to tolerate fluids by mouth return to the ER immediately for a recheck.   You may return to the ER at any time for worsening condition or any new symptoms that concern you.  Diverticulosis Diverticulosis is a common condition that develops when small pouches (diverticula) form in the wall of the colon. The risk of diverticulosis increases with age. It happens more often in people who eat a low-fiber diet. Most individuals with diverticulosis have no symptoms. Those individuals with symptoms usually experience abdominal pain, constipation, or diarrhea.  Eating a high-fiber diet has been shown to reduce discomfort and other symptoms of diverticulosis. Fiber may also slow the progression of diverticulosis. Foods having high fiber content include:  Baked beans, kidney beans, split peas, lentils.   Bran cereals, shredded wheat, bran muffins, whole-grain breads.   Fresh fruit, raisins, prunes.   Potatoes (with skin), broccoli, spinach, zucchini.  You may feel a little bloated when you introduce more fiber into your diet. These symptoms usually pass in time. Drink enough water and fluids to keep your urine clear or pale yellow, to prevent constipation. Try not to strain when you have a bowel movement. Some caregivers may recommend avoiding nuts and seeds to prevent complications of diverticulosis. Mild pain medicine may help soothe pain and spasms. Only take over-the-counter or prescription medicines for pain, discomfort, or fever as directed by your caregiver. Some complications of diverticulosis include an infection of the diverticula (diverticulitis), rectal bleeding,  or blockage of the colon (bowel obstruction). SEEK IMMEDIATE MEDICAL CARE IF:   You develop increasing pain or severe bloating.   You have an oral temperature above 102 F (38.9 C), not controlled by medicine.   You develop vomiting or bowel movements that are bloody or black.  Document Released: 12/02/2005 Document Revised: 11/21/2011 Document Reviewed: 05/02/2010 [facility] Patient Information 2012 [place], [place].

## 2012-06-10 NOTE — ED Notes (Signed)
Pt still over with radiology

## 2012-06-10 NOTE — ED Notes (Signed)
14 Fr foley inserted with no resistance.  Clear/Yellow urine returned

## 2012-06-10 NOTE — ED Provider Notes (Signed)
12:55 PM Patient is in CDU holding for CT abd/pelvis.  Sign out received from Dr Karma Ganja.   Pt presenting with abdominal pain, nausea, low back pain, pain from decubitus ulcer on buttock.  Patient reports she is currently pain and nausea free.  On exam, pt is A&Ox4, NAD, RRR, CTAB, abdomen is soft, nondistended, tender to palpation in epigastrium and left abdomen, no guarding, no rebound.  Pt is resident at [facility].  Per discussion with Dr Karma Ganja, if CT is negative, pt may be discharged home to assisted living facility.    1:57 PM Patient continues to be pain free. Discussed CT results with patient.  Plan is for d/c home with PCP follow up.  Urine has been sent for culture.  Patient verbalizes understanding and agrees with plan.    Results for orders placed during the hospital encounter of 06/10/12  CBC      Component Value Range   WBC 9.0  4.0 - 10.5 K/uL   RBC 4.60  3.87 - 5.11 MIL/uL   Hemoglobin 14.7  12.0 - 15.0 g/dL   HCT 16.1  09.6 - 04.5 %   MCV 95.2  78.0 - 100.0 fL   MCH 32.0  26.0 - 34.0 pg   MCHC 33.6  30.0 - 36.0 g/dL   RDW 40.9  81.1 - 91.4 %   Platelets 165  150 - 400 K/uL  COMPREHENSIVE METABOLIC PANEL      Component Value Range   Sodium 142  135 - 145 mEq/L   Potassium 3.1 (*) 3.5 - 5.1 mEq/L   Chloride 104  96 - 112 mEq/L   CO2 25  19 - 32 mEq/L   Glucose, Bld 112 (*) 70 - 99 mg/dL   BUN 29 (*) 6 - 23 mg/dL   Creatinine, Ser 7.82 (*) 0.50 - 1.10 mg/dL   Calcium 9.3  8.4 - 95.6 mg/dL   Total Protein 7.4  6.0 - 8.3 g/dL   Albumin 3.6  3.5 - 5.2 g/dL   AST 19  0 - 37 U/L   ALT 12  0 - 35 U/L   Alkaline Phosphatase 66  39 - 117 U/L   Total Bilirubin 0.3  0.3 - 1.2 mg/dL   GFR calc non Af Amer 29 (*) >90 mL/min   GFR calc Af Amer 33 (*) >90 mL/min  LIPASE, BLOOD      Component Value Range   Lipase 47  11 - 59 U/L  URINALYSIS, ROUTINE W REFLEX MICROSCOPIC      Component Value Range   Color, Urine YELLOW  YELLOW   APPearance CLEAR  CLEAR   Specific Gravity,  Urine 1.008  1.005 - 1.030   pH 6.5  5.0 - 8.0   Glucose, UA NEGATIVE  NEGATIVE mg/dL   Hgb urine dipstick NEGATIVE  NEGATIVE   Bilirubin Urine NEGATIVE  NEGATIVE   Ketones, ur NEGATIVE  NEGATIVE mg/dL   Protein, ur NEGATIVE  NEGATIVE mg/dL   Urobilinogen, UA 0.2  0.0 - 1.0 mg/dL   Nitrite NEGATIVE  NEGATIVE   Leukocytes, UA NEGATIVE  NEGATIVE   Ct Abdomen Pelvis W Contrast  06/10/2012  *RADIOLOGY REPORT*  Clinical Data: 78 year old with abdominal pain.  CT ABDOMEN AND PELVIS WITH CONTRAST  Technique:  Multidetector CT imaging of the abdomen and pelvis was performed following the standard protocol during bolus administration of intravenous contrast.  Contrast:  80 ml Omnipaque-300  Comparison: 12/23/2006  Findings: Lung bases are clear.  There is no  evidence for free air. There is motion artifact on this examination.  There is mild fullness of the intrahepatic and extrahepatic biliary system. Gallbladder has been removed.  No gross abnormality to the liver. Poor evaluation of the portal venous system.  There is stable adrenal gland thickening.  Evidence for mild cortical atrophy in both kidneys.  Probable cyst along the right kidney lower pole. Atherosclerotic calcifications of the abdominal aorta without aneurysm.  No evidence for free fluid or lymphadenopathy.  No gross abnormality to the spleen or pancreas.  Uterus has been removed.  There is a Foley catheter within the urinary bladder.  There is extensive diverticulosis involving the sigmoid colon but no definite pericolonic inflammation.  There are ventral hernias containing fat.  Bone windows demonstrate diffuse osteopenia.  IMPRESSION: No acute abnormalities within the abdomen or pelvis.  Diffuse colonic diverticulosis but no evidence for acute colonic inflammation.  Mild dilatation of the biliary system is likely related to age and cholecystectomy.  Original Report Authenticated By: Richarda Overlie, M.D.       Dillard Cannon [place], [place] 06/10/12 [phone]

## 2012-06-10 NOTE — ED Provider Notes (Signed)
Medical screening examination/treatment/procedure(s) were conducted as a shared visit with non-physician practitioner(s) and myself.  I personally evaluated the patient during the encounter  I saw this patient primarily  Isabella Villafuerte K Linker, MD 06/10/12 1410 

## 2012-06-10 NOTE — ED Notes (Signed)
Spoke with Isabella Mccoy at [facility]; informed that pt would be returning to facility.  Pt transported back by [facility]

## 2012-06-10 NOTE — ED Notes (Signed)
Pt slid up in bed and comfortable in bed.  Introduced self to patient.

## 2012-06-10 NOTE — ED Notes (Signed)
Generalized abdominal pain for 4 days,  Denies any vomiting or loose stool. Pt. Is a resident at [facility] and the  MD wanted her to come in to be evaluated,  He checked he stool for blood which was negative, but her stool was dark.  She also is having rectum pain, when sitting.   The RN at the facility reported that the pt.s abdomen is distended , more that normal.

## 2012-06-10 NOTE — ED Notes (Signed)
Reported to Dr. Karma Ganja that pt. Began to itch after receiving Morphine, no hives noted.

## 2012-06-12 LAB — URINE CULTURE
Colony Count: NO GROWTH
Culture  Setup Time: 201306261713

## 2012-07-14 ENCOUNTER — Emergency Department (HOSPITAL_COMMUNITY)
Admission: EM | Admit: 2012-07-14 | Discharge: 2012-07-15 | Disposition: A | Discharge status: Home / Self Care | Payer: Medicare Other | Attending: Emergency Medicine | Admitting: Emergency Medicine

## 2012-07-14 ENCOUNTER — Emergency Department (HOSPITAL_COMMUNITY): Payer: Medicare Other

## 2012-07-14 ENCOUNTER — Encounter (HOSPITAL_COMMUNITY): Payer: Self-pay

## 2012-07-14 DIAGNOSIS — F0390 Unspecified dementia without behavioral disturbance: Secondary | ICD-10-CM | POA: Insufficient documentation

## 2012-07-14 DIAGNOSIS — K219 Gastro-esophageal reflux disease without esophagitis: Secondary | ICD-10-CM | POA: Insufficient documentation

## 2012-07-14 DIAGNOSIS — I509 Heart failure, unspecified: Secondary | ICD-10-CM | POA: Insufficient documentation

## 2012-07-14 DIAGNOSIS — R0602 Shortness of breath: Secondary | ICD-10-CM | POA: Insufficient documentation

## 2012-07-14 DIAGNOSIS — I4891 Unspecified atrial fibrillation: Secondary | ICD-10-CM

## 2012-07-14 DIAGNOSIS — Z79899 Other long term (current) drug therapy: Secondary | ICD-10-CM | POA: Insufficient documentation

## 2012-07-14 DIAGNOSIS — E119 Type 2 diabetes mellitus without complications: Secondary | ICD-10-CM | POA: Insufficient documentation

## 2012-07-14 HISTORY — DX: Unspecified atrial fibrillation: I48.91

## 2012-07-14 HISTORY — DX: Type 2 diabetes mellitus without complications: E11.9

## 2012-07-14 NOTE — ED Notes (Addendum)
From [facility]; EMS called out for SOB. SPO2 96% on RA. Lung sounds CTA. No resp distress. EKG A-fib. AAO @baseline ? hx Dementia. Slightly anxious, RR 18-24. Pt also c/o back pain and requesting pain medications on arrival.

## 2012-07-14 NOTE — ED Notes (Signed)
PA at bedside.

## 2012-07-14 NOTE — ED Notes (Signed)
89 hx of Dementia - complaining to Family on phone of SOB, sent for same by NH staff.  States has gotten much better since earlier today when had onset - unsure of time of onset.  Denies cP, denies other concerns.  Lungs clear, no distress with RR of 20, no rales, ronchi, wheezing.  Speeks in full sentences, afib present with strong pulses, abd nt, no edema / jvd  ECG with afib, controlled response.  Consider arrhythmia, pna, chf, less likely PE - no tachy, no hypoxia and recent CTA in last 7 months neg for PE.  Medical screening examination/treatment/procedure(s) were conducted as a shared visit with non-physician practitioner(s) and myself.  I personally evaluated the patient during the encounter    Vida Roller, MD 07/15/12 [phone]

## 2012-07-14 NOTE — ED Provider Notes (Signed)
History     CSN: [id number]  Arrival date & time 07/14/12  2149   First MD Initiated Contact with Patient 07/14/12 2302      Chief Complaint  Patient presents with  . Shortness of Breath    (Consider location/radiation/quality/duration/timing/severity/associated sxs/prior treatment) HPI Comments: 78 y/o female presents by EMS with SOB since "earlier tonight". States she was talking on the phone with her son and became short of breath, and her assisted living facility called EMS. Denies ever being SOB before. Denies chest pain, fever, cough, dysuria, urgency, frequency, abdominal pain, nausea, confusion, dizziness, syncope. Patient keeps asking if her son is here in ED.   Patient is a 78 y.o. female presenting with shortness of breath. The history is provided by the patient. The history is limited by the condition of the patient (patient has hx of dementia).  Shortness of Breath  Associated symptoms include shortness of breath. Pertinent negatives include no chest pain, no fever and no cough.    Past Medical History  Diagnosis Date  . CHF (congestive heart failure)   . Dementia   . Hypertension   . Coronary artery disease   . GERD (gastroesophageal reflux disease)   . Diabetes mellitus     History reviewed. No pertinent past surgical history.  No family history on file.  History  Substance Use Topics  . Smoking status: Never Smoker   . Smokeless tobacco: Not on file  . Alcohol Use: No    OB History    Grav Para Term Preterm Abortions TAB SAB Ect Mult Living                  Review of Systems  Constitutional: Negative for fever.  Respiratory: Positive for shortness of breath. Negative for cough.   Cardiovascular: Negative for chest pain.  Gastrointestinal: Negative for nausea and abdominal pain.  Genitourinary: Negative for dysuria, urgency and frequency.  Neurological: Negative for dizziness and syncope.  Psychiatric/Behavioral: Negative for confusion.     Allergies  Morphine and related  Home Medications   Current Outpatient Rx  Name Route Sig Dispense Refill  . ACETAMINOPHEN ER 650 MG PO TBCR Oral Take 650 mg by mouth 3 (three) times daily. arthritis    . VITAMIN D 1000 UNITS PO TABS Oral Take 1,000 Units by mouth daily.      Marland Kitchen DOCUSATE SODIUM 100 MG PO CAPS Oral Take 100 mg by mouth at bedtime.    . FUROSEMIDE 80 MG PO TABS Oral Take 160 mg by mouth 2 (two) times daily.     Marland Kitchen GLIPIZIDE 5 MG PO TABS Oral Take 2.5 mg by mouth daily.      Marland Kitchen HYDROCODONE-ACETAMINOPHEN 5-500 MG PO TABS Oral Take 1 tablet by mouth every 6 (six) hours as needed. For pain    . LOPERAMIDE HCL 2 MG PO CAPS Oral Take 2 mg by mouth once as needed. For diarrhea    . ADULT MULTIVITAMIN W/MINERALS CH Oral Take 1 tablet by mouth daily.    Marland Kitchen PROMETHAZINE HCL 12.5 MG PO TABS Oral Take 12.5 mg by mouth every 6 (six) hours as needed. For nausea/vomiting    . PSYLLIUM PO Oral Take 500 mg by mouth at bedtime.    . SENNA-DOCUSATE SODIUM 8.6-50 MG PO TABS Oral Take 1 tablet by mouth 2 (two) times daily.    . EUCERIN EX CREA Topical Apply 1 application topically 2 (two) times daily as needed. To arms    . TRAZODONE HCL  100 MG PO TABS Oral Take 100 mg by mouth at bedtime.      BP 155/64  Pulse 62  Temp 98.7 F (37.1 C) (Oral)  Resp 23  SpO2 96%  Physical Exam  Nursing note and vitals reviewed. Constitutional: She is oriented to person, place, and time. She appears well-developed and well-nourished. No distress.  HENT:  Head: Normocephalic and atraumatic.  Mouth/Throat: Oropharynx is clear and moist.  Eyes: Conjunctivae and EOM are normal. Pupils are equal, round, and reactive to light.  Neck: Neck supple.  Cardiovascular: Normal rate, normal heart sounds and intact distal pulses.  An irregular rhythm present.       Negative pitting edema  Pulmonary/Chest: Breath sounds normal. No accessory muscle usage. Tachypnea noted.  Abdominal: Soft. Bowel sounds are normal.  There is no tenderness.  Neurological: She is alert and oriented to person, place, and time.  Skin: Skin is warm and dry. No rash noted. She is not diaphoretic.  Psychiatric: She has a normal mood and affect.       Patient seems confused    ED Course  Procedures (including critical care time)   Labs Reviewed  CBC WITH DIFFERENTIAL  COMPREHENSIVE METABOLIC PANEL  URINALYSIS, ROUTINE W REFLEX MICROSCOPIC   Results for orders placed during the hospital encounter of 07/14/12  CBC WITH DIFFERENTIAL      Component Value Range   WBC 12.1 (*) 4.0 - 10.5 K/uL   RBC 4.98  3.87 - 5.11 MIL/uL   Hemoglobin 16.1 (*) 12.0 - 15.0 g/dL   HCT 62.1 (*) 30.8 - 65.7 %   MCV 95.4  78.0 - 100.0 fL   MCH 32.3  26.0 - 34.0 pg   MCHC 33.9  30.0 - 36.0 g/dL   RDW 84.6  96.2 - 95.2 %   Platelets 173  150 - 400 K/uL   Neutrophils Relative 76  43 - 77 %   Neutro Abs 9.2 (*) 1.7 - 7.7 K/uL   Lymphocytes Relative 14  12 - 46 %   Lymphs Abs 1.7  0.7 - 4.0 K/uL   Monocytes Relative 8  3 - 12 %   Monocytes Absolute 1.0  0.1 - 1.0 K/uL   Eosinophils Relative 1  0 - 5 %   Eosinophils Absolute 0.1  0.0 - 0.7 K/uL   Basophils Relative 0  0 - 1 %   Basophils Absolute 0.0  0.0 - 0.1 K/uL  COMPREHENSIVE METABOLIC PANEL      Component Value Range   Sodium 142  135 - 145 mEq/L   Potassium 3.5  3.5 - 5.1 mEq/L   Chloride 102  96 - 112 mEq/L   CO2 26  19 - 32 mEq/L   Glucose, Bld 139 (*) 70 - 99 mg/dL   BUN 39 (*) 6 - 23 mg/dL   Creatinine, Ser 8.41 (*) 0.50 - 1.10 mg/dL   Calcium 9.5  8.4 - 32.4 mg/dL   Total Protein 7.7  6.0 - 8.3 g/dL   Albumin 4.1  3.5 - 5.2 g/dL   AST 27  0 - 37 U/L   ALT 18  0 - 35 U/L   Alkaline Phosphatase 68  39 - 117 U/L   Total Bilirubin 0.4  0.3 - 1.2 mg/dL   GFR calc non Af Amer 31 (*) >90 mL/min   GFR calc Af Amer 36 (*) >90 mL/min  URINALYSIS, ROUTINE W REFLEX MICROSCOPIC      Component Value Range   Color,  Urine YELLOW  YELLOW   APPearance CLEAR  CLEAR   Specific  Gravity, Urine 1.011  1.005 - 1.030   pH 6.5  5.0 - 8.0   Glucose, UA NEGATIVE  NEGATIVE mg/dL   Hgb urine dipstick NEGATIVE  NEGATIVE   Bilirubin Urine NEGATIVE  NEGATIVE   Ketones, ur NEGATIVE  NEGATIVE mg/dL   Protein, ur NEGATIVE  NEGATIVE mg/dL   Urobilinogen, UA 0.2  0.0 - 1.0 mg/dL   Nitrite NEGATIVE  NEGATIVE   Leukocytes, UA NEGATIVE  NEGATIVE  PRO B NATRIURETIC PEPTIDE      Component Value Range   Pro B Natriuretic peptide (BNP) 1544.0 (*) 0 - 450 pg/mL  D-DIMER, QUANTITATIVE      Component Value Range   D-Dimer, Quant 1.41 (*) 0.00 - 0.48 ug/mL-FEU  POCT I-STAT TROPONIN I      Component Value Range   Troponin i, poc 0.02  0.00 - 0.08 ng/mL   Comment 3              Dg Chest 2 View  07/14/2012  *RADIOLOGY REPORT*  Clinical Data: Shortness of breath.  CHEST - 2 VIEW  Comparison: 12/14/2011  Findings: Heart is enlarged.  There are perihilar bronchitic changes.  No focal consolidations or pleural effusions are identified.  No edema.  Degenerative changes are seen in the spine. The aorta is calcified.  IMPRESSION: Cardiomegaly.  No pulmonary edema.  Original Report Authenticated By: Patterson Hammersmith, M.D.   Ct Angio Chest Pe W/cm &/or Wo Cm  07/15/2012  *RADIOLOGY REPORT*  Clinical Data: Shortness of breath  CT ANGIOGRAPHY CHEST  Technique:  Multidetector CT imaging of the chest using the standard protocol during bolus administration of intravenous contrast. Multiplanar reconstructed images including MIPs were obtained and reviewed to evaluate the vascular anatomy.  Contrast: 80mL OMNIPAQUE IOHEXOL 350 MG/ML SOLN  Comparison: 12/14/2011 chest CT  Findings: No pulmonary arterial branch filling defect identified. Unfortunately, due to respiratory motion, segmental and subsegmental branches are poorly evaluated.  Aortic atherosclerosis.  Cardiomegaly. Contour bulge at the left ventricular apex with peripheral blood pool attenuation, unchanged. Coronary artery calcification.  No pleural  or pericardial effusion.  No intrathoracic lymphadenopathy.  Respiratory motion degrades detailed parenchymal evaluation. Allowing for this, mild lung base atelectasis.  Otherwise, no focal areas of consolidation.  No pneumothorax.  Central airway is degraded by rest for motion however grossly patent.  Limited images through the upper abdomen and demonstrate nonspecific mild biliary ductal prominence.  Bilateral adreniform enlargement without a dominant/measurable nodule.  Bilateral lobular renal contours and an incompletely characterized hypodensity arising from the upper pole of the right kidney, favored to represent a cyst given its water density.  T11 compression deformity is similar to prior.  Multilevel degenerative changes.  No acute osseous finding.  IMPRESSION: No pulmonary embolism identified within the central branches.  The peripheral branches are degraded by respiratory motion.  Cardiomegaly and coronary artery calcification. Unchanged sequelae of prior infarct versus small aneurysm at the left ventricular apex.  No focal consolidation.  Mild biliary ductal prominence is similar to prior.  Correlate with LFTs.  Original Report Authenticated By: Waneta Martins, M.D.    Date: 07/14/2012  Rate: 66  Rhythm: atrial fibrillation  QRS Axis: normal  Intervals: normal  ST/T Wave abnormalities: nonspecific T wave changes  Conduction Disutrbances:none  Old EKG Reviewed: none available    1. Shortness of breath   2. Atrial fibrillation       MDM  Obtaining labs/imaging to differentiate possible A-fib, CHF, UTI, PE 2:10 AM CXR no evidence of acute cardiopulmonary disease. CT angio negative for PE. BNP elevated. No prior result to compare. No evidence of UTI. Discharge with 20mg  lasix qd x 7 days and follow up with PCP ASAP. Case discussed with Dr. Hyacinth Meeker who agrees with plan of care. Patient stable for discharge.       Trevor Mace, PA-C 07/15/12 0225  Trevor Mace,  PA-C 07/15/12 [phone]

## 2012-07-15 ENCOUNTER — Emergency Department (HOSPITAL_COMMUNITY): Payer: Medicare Other

## 2012-07-15 ENCOUNTER — Encounter (HOSPITAL_COMMUNITY): Payer: Self-pay | Admitting: Radiology

## 2012-07-15 LAB — CBC WITH DIFFERENTIAL/PLATELET
Basophils Absolute: 0 10*3/uL (ref 0.0–0.1)
Eosinophils Relative: 1 % (ref 0–5)
Lymphocytes Relative: 14 % (ref 12–46)
Lymphs Abs: 1.7 10*3/uL (ref 0.7–4.0)
MCV: 95.4 fL (ref 78.0–100.0)
Neutro Abs: 9.2 10*3/uL — ABNORMAL HIGH (ref 1.7–7.7)
Neutrophils Relative %: 76 % (ref 43–77)
Platelets: 173 10*3/uL (ref 150–400)
RBC: 4.98 MIL/uL (ref 3.87–5.11)
RDW: 13.9 % (ref 11.5–15.5)
WBC: 12.1 10*3/uL — ABNORMAL HIGH (ref 4.0–10.5)

## 2012-07-15 LAB — COMPREHENSIVE METABOLIC PANEL
ALT: 18 U/L (ref 0–35)
AST: 27 U/L (ref 0–37)
Alkaline Phosphatase: 68 U/L (ref 39–117)
CO2: 26 mEq/L (ref 19–32)
Calcium: 9.5 mg/dL (ref 8.4–10.5)
Chloride: 102 mEq/L (ref 96–112)
GFR calc Af Amer: 36 mL/min — ABNORMAL LOW (ref >90)
GFR calc non Af Amer: 31 mL/min — ABNORMAL LOW (ref >90)
Glucose, Bld: 139 mg/dL — ABNORMAL HIGH (ref 70–99)
Potassium: 3.5 mEq/L (ref 3.5–5.1)
Sodium: 142 mEq/L (ref 135–145)

## 2012-07-15 LAB — URINALYSIS, ROUTINE W REFLEX MICROSCOPIC
Bilirubin Urine: NEGATIVE
Hgb urine dipstick: NEGATIVE
Ketones, ur: NEGATIVE mg/dL
Specific Gravity, Urine: 1.011 (ref 1.005–1.030)
Urobilinogen, UA: 0.2 mg/dL (ref 0.0–1.0)

## 2012-07-15 LAB — POCT I-STAT TROPONIN I: Troponin i, poc: 0.02 ng/mL (ref 0.00–0.08)

## 2012-07-15 LAB — PRO B NATRIURETIC PEPTIDE: Pro B Natriuretic peptide (BNP): 1544 pg/mL — ABNORMAL HIGH (ref 0–450)

## 2012-07-15 MED ORDER — IOHEXOL 350 MG/ML SOLN
80.0000 mL | Freq: Once | INTRAVENOUS | Status: AC | PRN
  Administered 2012-07-15: 80 mL via INTRAVENOUS

## 2012-07-15 MED ORDER — FUROSEMIDE 20 MG PO TABS: 20.0000 mg | ORAL_TABLET | Freq: Every day | ORAL | Status: DC

## 2012-07-15 NOTE — ED Provider Notes (Signed)
Medical screening examination/treatment/procedure(s) were conducted as a shared visit with non-physician practitioner(s) and myself.  I personally evaluated the patient during the encounter  Please see my separate respective documentation pertaining to this patient encounter   Vida Roller, MD 07/15/12 [phone]

## 2012-07-15 NOTE — ED Notes (Addendum)
Pt alert at time of discharge. Pt given discharge papers and papers explained. All questions answered and pt stretcher to discharge. Pt taken home via [facility]

## 2012-07-15 NOTE — ED Notes (Signed)
Pt cleaned and linen changed. Pt also in and out cathed for urine sample

## 2012-07-15 NOTE — ED Notes (Signed)
Pt ready for discharge awaiting PTAR

## 2012-07-15 NOTE — ED Notes (Signed)
Pt still awaiting PTAR

## 2012-10-23 ENCOUNTER — Encounter (HOSPITAL_COMMUNITY): Payer: Self-pay | Admitting: Emergency Medicine

## 2012-10-23 ENCOUNTER — Emergency Department (HOSPITAL_COMMUNITY)
Admission: EM | Admit: 2012-10-23 | Discharge: 2012-10-23 | Disposition: A | Discharge status: Home / Self Care | Payer: Medicare Other | Attending: Emergency Medicine | Admitting: Emergency Medicine

## 2012-10-23 DIAGNOSIS — E119 Type 2 diabetes mellitus without complications: Secondary | ICD-10-CM | POA: Insufficient documentation

## 2012-10-23 DIAGNOSIS — I2510 Atherosclerotic heart disease of native coronary artery without angina pectoris: Secondary | ICD-10-CM | POA: Insufficient documentation

## 2012-10-23 DIAGNOSIS — M79609 Pain in unspecified limb: Secondary | ICD-10-CM

## 2012-10-23 DIAGNOSIS — K219 Gastro-esophageal reflux disease without esophagitis: Secondary | ICD-10-CM | POA: Insufficient documentation

## 2012-10-23 DIAGNOSIS — R609 Edema, unspecified: Secondary | ICD-10-CM

## 2012-10-23 DIAGNOSIS — F0390 Unspecified dementia without behavioral disturbance: Secondary | ICD-10-CM | POA: Insufficient documentation

## 2012-10-23 DIAGNOSIS — I10 Essential (primary) hypertension: Secondary | ICD-10-CM | POA: Insufficient documentation

## 2012-10-23 DIAGNOSIS — I4891 Unspecified atrial fibrillation: Secondary | ICD-10-CM | POA: Insufficient documentation

## 2012-10-23 DIAGNOSIS — M7989 Other specified soft tissue disorders: Secondary | ICD-10-CM

## 2012-10-23 LAB — CBC WITH DIFFERENTIAL/PLATELET
Basophils Relative: 0 % (ref 0–1)
Eosinophils Absolute: 0.1 10*3/uL (ref 0.0–0.7)
HCT: 44.3 % (ref 36.0–46.0)
Hemoglobin: 15.1 g/dL — ABNORMAL HIGH (ref 12.0–15.0)
MCH: 32.8 pg (ref 26.0–34.0)
MCHC: 34.1 g/dL (ref 30.0–36.0)
Monocytes Absolute: 0.7 10*3/uL (ref 0.1–1.0)
Monocytes Relative: 8 % (ref 3–12)

## 2012-10-23 LAB — BASIC METABOLIC PANEL
BUN: 35 mg/dL — ABNORMAL HIGH (ref 6–23)
Calcium: 9.2 mg/dL (ref 8.4–10.5)
Creatinine, Ser: 1.43 mg/dL — ABNORMAL HIGH (ref 0.50–1.10)
GFR calc Af Amer: 37 mL/min — ABNORMAL LOW (ref >90)
GFR calc non Af Amer: 32 mL/min — ABNORMAL LOW (ref >90)

## 2012-10-23 MED ORDER — ACETAMINOPHEN 325 MG PO TABS
650.0000 mg | ORAL_TABLET | Freq: Once | ORAL | Status: AC
  Administered 2012-10-23: 650 mg via ORAL
  Filled 2012-10-23: qty 2

## 2012-10-23 MED ORDER — ACETAMINOPHEN 325 MG PO TABS: 650.0000 mg | ORAL_TABLET | Freq: Once | ORAL | Status: DC

## 2012-10-23 NOTE — Progress Notes (Signed)
Left:  No evidence of DVT, superficial thrombosis, or Baker's cyst.  Right:  Negative for DVT in the common femoral vein.  

## 2012-10-23 NOTE — ED Provider Notes (Signed)
2:19 PM Patient with a hx sig for bilateral leg swelling and tenderness was placed in CDU on observation by Fayrene Helper, PA-C. Patient care resumed from Fayrene Helper, [place] .  Patient is here for pending lower extremity doppler study. Patient re-evaluated and is resting comfortable, VSS, with no new complaints or concerns at this time. Plan per previous provider is to disposition the patient depending on doppler results. If the results are negative, patient can be discharged. On exam: hemodynamically stable, NAD, heart w/ RRR, lungs CTAB, Chest & abd non-tender, 2+ pitting peripheral edema and calf tenderness to palpation.   Lower extremity venous doppler negative for DVT. Patient can be discharged without further evaluation.    Emilia Beck, PA-C 10/23/12 1423

## 2012-10-23 NOTE — ED Provider Notes (Signed)
Medical screening examination/treatment/procedure(s) were conducted as a shared visit with non-physician practitioner(s) and myself.  I personally evaluated the patient during the encounter. Swelling in her lower extremities. No DVT. Renal function decent. Discharge  Juliet Rude. Rubin Payor, MD 10/23/12 [phone]

## 2012-10-23 NOTE — ED Provider Notes (Signed)
Medical screening examination/treatment/procedure(s) were conducted as a shared visit with non-physician practitioner(s) and myself.  I personally evaluated the patient during the encounter.  Juliet Rude. Rubin Payor, MD 10/23/12 [phone]

## 2012-10-23 NOTE — ED Notes (Signed)
Pt arrives to ed c/o left ankle edema x 3 days. Pmsx4.  Pt sts hx cellulitis.  Pt unable to bear wt.  No obvious deformity/injury.  Pt denies recent illness/injury.

## 2012-10-23 NOTE — ED Provider Notes (Signed)
History     CSN: [id number]  Arrival date & time 10/23/12  7829   First MD Initiated Contact with Patient 10/23/12 1121      Chief Complaint  Patient presents with  . Leg Swelling    (Consider location/radiation/quality/duration/timing/severity/associated sxs/prior treatment) HPI  78 year old female with history of CHF, atrial fibrillation, and diabetes complaining of left lower leg pain. Patient is a poor historian. Patient reports that she is at a nursing facility. For the past 3 or 4 days she has noticed increasing pain and swelling to the left lower leg. Pain is described as "achy all over", persistent, worsening with walking area and she uses a walker to assist. Patient does have history of cellulitis. She denies fever, chills, chest pain, shortness of breath, or rash. She denies any recent trauma. Patient denies any recent surgery, and does not recall any prior history of DVT or PE. She has been taking medication as prescribed.  Past Medical History  Diagnosis Date  . CHF (congestive heart failure)   . Dementia   . Hypertension   . Coronary artery disease   . GERD (gastroesophageal reflux disease)   . Diabetes mellitus   . Atrial fibrillation     No past surgical history on file.  No family history on file.  History  Substance Use Topics  . Smoking status: Never Smoker   . Smokeless tobacco: Not on file  . Alcohol Use: No    OB History    Grav Para Term Preterm Abortions TAB SAB Ect Mult Living                  Review of Systems  Unable to perform ROS: Dementia  All other systems reviewed and are negative.    Allergies  Morphine and related  Home Medications   Current Outpatient Rx  Name  Route  Sig  Dispense  Refill  . ACETAMINOPHEN ER 650 MG PO TBCR   Oral   Take 650 mg by mouth 3 (three) times daily. arthritis         . AMLODIPINE BESYLATE 5 MG PO TABS   Oral   Take 5 mg by mouth daily.         . ASPIRIN EC 81 MG PO TBEC   Oral   Take  81 mg by mouth daily.         Marland Kitchen VITAMIN D 1000 UNITS PO TABS   Oral   Take 1,000 Units by mouth daily.         Marland Kitchen DOCUSATE SODIUM 100 MG PO CAPS   Oral   Take 100 mg by mouth at bedtime.         . FUROSEMIDE 80 MG PO TABS   Oral   Take 160 mg by mouth 2 (two) times daily.          . FUROSEMIDE 80 MG PO TABS   Oral   Take 160 mg by mouth 2 (two) times daily.         Marland Kitchen GLIPIZIDE 5 MG PO TABS   Oral   Take 2.5 mg by mouth daily.           Marland Kitchen HYDROCERIN EX CREA   Topical   Apply 1 application topically 2 (two) times daily.         Marland Kitchen HYDROCODONE-ACETAMINOPHEN 5-500 MG PO TABS   Oral   Take 1 tablet by mouth every 6 (six) hours as needed. For pain         .  LEVOTHYROXINE SODIUM 25 MCG PO TABS   Oral   Take 12.5 mcg by mouth daily.         Marland Kitchen LOPERAMIDE HCL 2 MG PO CAPS   Oral   Take 2 mg by mouth daily as needed. For diarrhea         . LORAZEPAM 0.5 MG PO TABS   Oral   Take 0.25 mg by mouth every 6 (six) hours as needed. For anxiety.         Marland Kitchen LORAZEPAM 0.5 MG PO TABS   Oral   Take 0.5 mg by mouth 2 (two) times daily.         . CEROVITE PO   Oral   Take 1 tablet by mouth daily.         Marland Kitchen OMEPRAZOLE 20 MG PO CPDR   Oral   Take 20 mg by mouth daily.         Marland Kitchen PROMETHAZINE HCL 12.5 MG PO TABS   Oral   Take 12.5 mg by mouth every 6 (six) hours as needed. For nausea.         . PSYLLIUM 0.52 G PO CAPS   Oral   Take 0.52 g by mouth at bedtime.         . TRAZODONE HCL 100 MG PO TABS   Oral   Take 100 mg by mouth at bedtime.           BP 156/66  Pulse 69  Temp 97.1 F (36.2 C) (Oral)  Resp 16  SpO2 97%  Physical Exam  Nursing note and vitals reviewed. Constitutional: She appears well-developed and well-nourished. No distress.       Moderate obese. Appears to be in no acute distress. Hard of hearing.  HENT:  Head: Normocephalic and atraumatic.       Lips dry  Eyes: Conjunctivae normal are normal.  Neck: Normal range of  motion. Neck supple.  Cardiovascular:       Irregularly regular without murmurs rubs, or gallop  Pulmonary/Chest: Effort normal and breath sounds normal. No respiratory distress. She has no rales. She exhibits no tenderness.  Abdominal: Soft. There is no tenderness.  Musculoskeletal: She exhibits edema and tenderness.       Bilateral lower extremities with 1+ pitting edema. Both legs are tender on palpation diffusely without focal point tenderness. Right leg with dressing placed on right anterior tibia area. Difficult to palpate pulse bilaterally however patient has brisk cap refill to all toes.  No palpable cord, or erythema noted  Neurological: She is alert.    ED Course  Procedures (including critical care time)  Labs Reviewed - No data to display No results found.   No diagnosis found.  Results for orders placed during the hospital encounter of 10/23/12  CBC WITH DIFFERENTIAL      Component Value Range   WBC 9.7  4.0 - 10.5 K/uL   RBC 4.60  3.87 - 5.11 MIL/uL   Hemoglobin 15.1 (*) 12.0 - 15.0 g/dL   HCT 60.4  54.0 - 98.1 %   MCV 96.3  78.0 - 100.0 fL   MCH 32.8  26.0 - 34.0 pg   MCHC 34.1  30.0 - 36.0 g/dL   RDW 19.1  47.8 - 29.5 %   Platelets 175  150 - 400 K/uL   Neutrophils Relative 71  43 - 77 %   Neutro Abs 6.9  1.7 - 7.7 K/uL   Lymphocytes Relative 20  12 -  46 %   Lymphs Abs 2.0  0.7 - 4.0 K/uL   Monocytes Relative 8  3 - 12 %   Monocytes Absolute 0.7  0.1 - 1.0 K/uL   Eosinophils Relative 1  0 - 5 %   Eosinophils Absolute 0.1  0.0 - 0.7 K/uL   Basophils Relative 0  0 - 1 %   Basophils Absolute 0.0  0.0 - 0.1 K/uL  BASIC METABOLIC PANEL      Component Value Range   Sodium 139  135 - 145 mEq/L   Potassium 3.3 (*) 3.5 - 5.1 mEq/L   Chloride 101  96 - 112 mEq/L   CO2 28  19 - 32 mEq/L   Glucose, Bld 100 (*) 70 - 99 mg/dL   BUN 35 (*) 6 - 23 mg/dL   Creatinine, Ser 1.61 (*) 0.50 - 1.10 mg/dL   Calcium 9.2  8.4 - 09.6 mg/dL   GFR calc non Af Amer 32 (*) >90  mL/min   GFR calc Af Amer 37 (*) >90 mL/min  PRO B NATRIURETIC PEPTIDE      Component Value Range   Pro B Natriuretic peptide (BNP) 1349.0 (*) 0 - 450 pg/mL   No results found.  1. Pedal edema  MDM  Patient presents complaining of left leg pain and swelling. Low suspicion for arterial occlusion. She does have 1+ pitting edema to both legs. Plan to obtain venous Doppler to rule out DVT. Care were discussed with my attending. Tylenol given for pain. Will check basic labs and check renal function.  NO recent trauma, no deformity noted on exam.     2:17 PM Pt move to CDU for further care, CDU provider will continue to monitor.  Pt otherwise stable, no signs of infection.  No CP or SOB to suggest PE or MI.    BP 156/66  Pulse 69  Temp 97.1 F (36.2 C) (Oral)  Resp 16  SpO2 97%        Fayrene Helper, PA-C 10/23/12 1419  Fayrene Helper, PA-C 10/23/12 1543

## 2012-10-23 NOTE — ED Notes (Signed)
Pt transported to [facility] vascular 

## 2012-10-23 NOTE — ED Notes (Signed)
Called [facility] [phone]. Spoke with laquita about having someone come pick up pt and take her back to the facility. She advises that the bus will not be back until around 4 pm and they will come get her then.

## 2012-10-23 NOTE — ED Notes (Signed)
PT TRANSPORTATION HAS ARRIVED

## 2013-04-25 ENCOUNTER — Emergency Department (HOSPITAL_COMMUNITY): Payer: Medicare Other

## 2013-04-25 ENCOUNTER — Emergency Department (HOSPITAL_COMMUNITY)
Admission: EM | Admit: 2013-04-25 | Discharge: 2013-04-25 | Disposition: A | Discharge status: Home / Self Care | Payer: Medicare Other | Attending: Emergency Medicine | Admitting: Emergency Medicine

## 2013-04-25 DIAGNOSIS — W1809XA Striking against other object with subsequent fall, initial encounter: Secondary | ICD-10-CM | POA: Insufficient documentation

## 2013-04-25 DIAGNOSIS — I509 Heart failure, unspecified: Secondary | ICD-10-CM | POA: Insufficient documentation

## 2013-04-25 DIAGNOSIS — S0990XA Unspecified injury of head, initial encounter: Secondary | ICD-10-CM | POA: Insufficient documentation

## 2013-04-25 DIAGNOSIS — Y9210 Unspecified residential institution as the place of occurrence of the external cause: Secondary | ICD-10-CM | POA: Insufficient documentation

## 2013-04-25 DIAGNOSIS — F0390 Unspecified dementia without behavioral disturbance: Secondary | ICD-10-CM | POA: Insufficient documentation

## 2013-04-25 DIAGNOSIS — K219 Gastro-esophageal reflux disease without esophagitis: Secondary | ICD-10-CM | POA: Insufficient documentation

## 2013-04-25 DIAGNOSIS — I2510 Atherosclerotic heart disease of native coronary artery without angina pectoris: Secondary | ICD-10-CM | POA: Insufficient documentation

## 2013-04-25 DIAGNOSIS — I4891 Unspecified atrial fibrillation: Secondary | ICD-10-CM | POA: Insufficient documentation

## 2013-04-25 DIAGNOSIS — Y9389 Activity, other specified: Secondary | ICD-10-CM | POA: Insufficient documentation

## 2013-04-25 DIAGNOSIS — Z7982 Long term (current) use of aspirin: Secondary | ICD-10-CM | POA: Insufficient documentation

## 2013-04-25 DIAGNOSIS — Z8709 Personal history of other diseases of the respiratory system: Secondary | ICD-10-CM | POA: Insufficient documentation

## 2013-04-25 DIAGNOSIS — W19XXXA Unspecified fall, initial encounter: Secondary | ICD-10-CM

## 2013-04-25 DIAGNOSIS — Z79899 Other long term (current) drug therapy: Secondary | ICD-10-CM | POA: Insufficient documentation

## 2013-04-25 DIAGNOSIS — E119 Type 2 diabetes mellitus without complications: Secondary | ICD-10-CM | POA: Insufficient documentation

## 2013-04-25 NOTE — ED Provider Notes (Signed)
History     CSN: [id number]  Arrival date & time 04/25/13  1419   First MD Initiated Contact with Patient 04/25/13 1422      No chief complaint on file.   (Consider location/radiation/quality/duration/timing/severity/associated sxs/prior treatment) HPI  79 year old female presents to the emergency department from assisted living facility for unwitnessed fall.  She has a past medical history of CHF, dementia and coronary artery disease lung with diabetes.  Patient does not remember her fall.  EMS reports that she appears to have stumbled over some flooring and fell forward and hit her head against the corner of a small refrigerator.  Sc[address]ty Court the patient calling from her room and called EMS.  The patient has multiple stories to varying providers and her history is unreliable.  Denies any neck tenderness.  He complains of some mild low back pain.  Past Medical History  Diagnosis Date  . CHF (congestive heart failure)   . Dementia   . Hypertension   . Coronary artery disease   . GERD (gastroesophageal reflux disease)   . Diabetes mellitus   . Atrial fibrillation     No past surgical history on file.  No family history on file.  History  Substance Use Topics  . Smoking status: Never Smoker   . Smokeless tobacco: Not on file  . Alcohol Use: No    OB History   Grav Para Term Preterm Abortions TAB SAB Ect Mult Living                  Review of Systems  Unable to perform ROS   Allergies  Morphine and related  Home Medications   Current Outpatient Rx  Name  Route  Sig  Dispense  Refill  . acetaminophen (TYLENOL) 650 MG CR tablet   Oral   Take 650 mg by mouth 3 (three) times daily. arthritis         . amLODipine (NORVASC) 5 MG tablet   Oral   Take 5 mg by mouth daily.         Marland Kitchen aspirin EC 81 MG tablet   Oral   Take 81 mg by mouth daily.         . cholecalciferol (VITAMIN D) 1000 UNITS tablet   Oral   Take 1,000 Units by mouth daily.         Marland Kitchen  docusate sodium (COLACE) 100 MG capsule   Oral   Take 100 mg by mouth at bedtime.         . furosemide (LASIX) 80 MG tablet   Oral   Take 160 mg by mouth 2 (two) times daily.          . furosemide (LASIX) 80 MG tablet   Oral   Take 160 mg by mouth 2 (two) times daily.         Marland Kitchen glipiZIDE (GLUCOTROL) 5 MG tablet   Oral   Take 2.5 mg by mouth daily.           . hydrocerin (EUCERIN) CREA   Topical   Apply 1 application topically 2 (two) times daily.         Marland Kitchen HYDROcodone-acetaminophen (VICODIN) 5-500 MG per tablet   Oral   Take 1 tablet by mouth every 6 (six) hours as needed. For pain         . levothyroxine (SYNTHROID, LEVOTHROID) 25 MCG tablet   Oral   Take 12.5 mcg by mouth daily.         Marland Kitchen  loperamide (IMODIUM) 2 MG capsule   Oral   Take 2 mg by mouth daily as needed. For diarrhea         . LORazepam (ATIVAN) 0.5 MG tablet   Oral   Take 0.25 mg by mouth every 6 (six) hours as needed. For anxiety.         Marland Kitchen LORazepam (ATIVAN) 0.5 MG tablet   Oral   Take 0.5 mg by mouth 2 (two) times daily.         . Multiple Vitamins-Minerals (CEROVITE PO)   Oral   Take 1 tablet by mouth daily.         Marland Kitchen omeprazole (PRILOSEC) 20 MG capsule   Oral   Take 20 mg by mouth daily.         . promethazine (PHENERGAN) 12.5 MG tablet   Oral   Take 12.5 mg by mouth every 6 (six) hours as needed. For nausea.         . psyllium (REGULOID) 0.52 G capsule   Oral   Take 0.52 g by mouth at bedtime.         . traZODone (DESYREL) 100 MG tablet   Oral   Take 100 mg by mouth at bedtime.           BP 148/60  Pulse 78  Temp(Src) 98 F (36.7 C) (Oral)  Resp 20  SpO2 100%  Physical Exam  Nursing note and vitals reviewed. Constitutional: She is oriented to person, place, and time. She appears well-developed and well-nourished. No distress.  HENT:  Head: Normocephalic and atraumatic.  Mouth/Throat: Oropharynx is clear and moist.  No signs of trauma  including laceration or hematoma.  Eyes: Conjunctivae and EOM are normal. Pupils are equal, round, and reactive to light. No scleral icterus.  Neck: Normal range of motion. Neck supple. No JVD present. No tracheal deviation present.  No midline tenderness.  Full range of motion.  Cardiovascular: Normal rate, regular rhythm and normal heart sounds.  Exam reveals no gallop and no friction rub.   No murmur heard. 2+ pitting edema three quarters of the way of the lower extremity the  Pulmonary/Chest: Effort normal and breath sounds normal. No respiratory distress. She has no wheezes.  Abdominal: Soft. Bowel sounds are normal. She exhibits no distension and no mass. There is no tenderness. There is no guarding.  Musculoskeletal:  Mildly tender to palpation over the left lumbar paraspinals.  No midline tenderness.  Bruising, swelling, ecchymosis or deformity.  Neurological: She is alert and oriented to person, place, and time.  Skin: Skin is warm and dry. She is not diaphoretic.    ED Course  Procedures (including critical care time)  Labs Reviewed - No data to display No results found.   1. Fall, initial encounter       MDM   is a pleasantly demented female who gives unreliable history.  Was unwitnessed and the patient does not remember the fall.  Patient is seen and shared visit with Dr. Fonnie Jarvis.  Will evaluating patient for intracranial bleed with CT of the head and neck.  She otherwise appears stable without visible trauma      3:30 PM BP 164/70  Pulse 67  Temp(Src) 98 F (36.7 C) (Oral)  Resp 20  SpO2 96% Patient without evidence of acute intracranial or cervical abnormality. Will d/c patient home to her ALF. Fall precautions The patient appears reasonably screened and/or stabilized for discharge and I doubt any other medical condition or  other EMC requiring further screening, evaluation, or treatment in the ED at this time prior to discharge.   Arthor Captain,  PA-C 04/25/13 1949

## 2013-04-25 NOTE — ED Provider Notes (Signed)
Medical screening examination/treatment/procedure(s) were conducted as a shared visit with non-physician practitioner(s) and myself.  I personally evaluated the patient during the encounter.  This 79 year old demented female is an unreliable historian, she has no idea why she is here except she was told she had a fall but does not remember falling, she is not sure if she hurt anything or not she transiently thought she might possibly have hurt her back but then doesn't remember hurting her back and has no back pain now is giving different answers to different providers who have seen her since her apparent fall which was unwitnessed at the nursing home, she will intermittently say she has pain all over her entire body and at other times he denies she has any pain whatsoever. EMS apparently thought she might have hit her head. On examination she is awake alert smiling pleasantly demented with no apparent tenderness to her cervical spine back oral 4 extremities chest or abdomen and is unlabored respirations but given her history of dementia with a potential history trauma with unreliable history of present illness CT scan of head and C-S are ordered.  Hurman Horn, MD 05/10/13 [phone]

## 2013-04-25 NOTE — ED Notes (Addendum)
PT fell while walking with walker . Pt does live at [facility] , Staff heard Pt calling and found PT on floor.

## 2013-09-23 ENCOUNTER — Emergency Department (HOSPITAL_COMMUNITY)
Admission: EM | Admit: 2013-09-23 | Discharge: 2013-09-24 | Disposition: A | Discharge status: Home / Self Care | Payer: Medicare Other | Attending: Emergency Medicine | Admitting: Emergency Medicine

## 2013-09-23 DIAGNOSIS — L0390 Cellulitis, unspecified: Secondary | ICD-10-CM

## 2013-09-23 DIAGNOSIS — E119 Type 2 diabetes mellitus without complications: Secondary | ICD-10-CM | POA: Insufficient documentation

## 2013-09-23 DIAGNOSIS — S79919A Unspecified injury of unspecified hip, initial encounter: Secondary | ICD-10-CM | POA: Insufficient documentation

## 2013-09-23 DIAGNOSIS — Y9210 Unspecified residential institution as the place of occurrence of the external cause: Secondary | ICD-10-CM | POA: Insufficient documentation

## 2013-09-23 DIAGNOSIS — R296 Repeated falls: Secondary | ICD-10-CM | POA: Insufficient documentation

## 2013-09-23 DIAGNOSIS — Z7982 Long term (current) use of aspirin: Secondary | ICD-10-CM | POA: Insufficient documentation

## 2013-09-23 DIAGNOSIS — Z79899 Other long term (current) drug therapy: Secondary | ICD-10-CM | POA: Insufficient documentation

## 2013-09-23 DIAGNOSIS — Z8719 Personal history of other diseases of the digestive system: Secondary | ICD-10-CM | POA: Insufficient documentation

## 2013-09-23 DIAGNOSIS — I4891 Unspecified atrial fibrillation: Secondary | ICD-10-CM | POA: Insufficient documentation

## 2013-09-23 DIAGNOSIS — Y9389 Activity, other specified: Secondary | ICD-10-CM | POA: Insufficient documentation

## 2013-09-23 DIAGNOSIS — I2510 Atherosclerotic heart disease of native coronary artery without angina pectoris: Secondary | ICD-10-CM | POA: Insufficient documentation

## 2013-09-23 DIAGNOSIS — L02419 Cutaneous abscess of limb, unspecified: Secondary | ICD-10-CM | POA: Insufficient documentation

## 2013-09-23 DIAGNOSIS — E876 Hypokalemia: Secondary | ICD-10-CM | POA: Insufficient documentation

## 2013-09-23 DIAGNOSIS — I509 Heart failure, unspecified: Secondary | ICD-10-CM | POA: Insufficient documentation

## 2013-09-23 DIAGNOSIS — W19XXXA Unspecified fall, initial encounter: Secondary | ICD-10-CM

## 2013-09-23 DIAGNOSIS — I10 Essential (primary) hypertension: Secondary | ICD-10-CM | POA: Insufficient documentation

## 2013-09-23 DIAGNOSIS — F0390 Unspecified dementia without behavioral disturbance: Secondary | ICD-10-CM | POA: Insufficient documentation

## 2013-09-24 ENCOUNTER — Encounter (HOSPITAL_COMMUNITY): Payer: Self-pay | Admitting: Radiology

## 2013-09-24 ENCOUNTER — Emergency Department (HOSPITAL_COMMUNITY): Payer: Medicare Other

## 2013-09-24 LAB — TYPE AND SCREEN: Antibody Screen: NEGATIVE

## 2013-09-24 LAB — PROTIME-INR: Prothrombin Time: 13.6 seconds (ref 11.6–15.2)

## 2013-09-24 LAB — CBC WITH DIFFERENTIAL/PLATELET
Basophils Absolute: 0 10*3/uL (ref 0.0–0.1)
Eosinophils Relative: 0 % (ref 0–5)
Lymphocytes Relative: 10 % — ABNORMAL LOW (ref 12–46)
Lymphs Abs: 1.1 10*3/uL (ref 0.7–4.0)
Neutro Abs: 8.9 10*3/uL — ABNORMAL HIGH (ref 1.7–7.7)
Neutrophils Relative %: 82 % — ABNORMAL HIGH (ref 43–77)
Platelets: 184 10*3/uL (ref 150–400)
RDW: 13.8 % (ref 11.5–15.5)
WBC: 10.9 10*3/uL — ABNORMAL HIGH (ref 4.0–10.5)

## 2013-09-24 LAB — BASIC METABOLIC PANEL
Calcium: 9.1 mg/dL (ref 8.4–10.5)
Creatinine, Ser: 1.4 mg/dL — ABNORMAL HIGH (ref 0.50–1.10)
GFR calc Af Amer: 37 mL/min — ABNORMAL LOW (ref >90)
GFR calc non Af Amer: 32 mL/min — ABNORMAL LOW (ref >90)

## 2013-09-24 LAB — GLUCOSE, CAPILLARY: Glucose-Capillary: 129 mg/dL — ABNORMAL HIGH (ref 70–99)

## 2013-09-24 MED ORDER — POTASSIUM CHLORIDE CRYS ER 20 MEQ PO TBCR
40.0000 meq | EXTENDED_RELEASE_TABLET | Freq: Once | ORAL | Status: AC
  Administered 2013-09-24: 40 meq via ORAL
  Filled 2013-09-24: qty 2

## 2013-09-24 MED ORDER — DOXYCYCLINE HYCLATE 100 MG PO CAPS: 100.0000 mg | ORAL_CAPSULE | Freq: Two times a day (BID) | ORAL | Status: DC

## 2013-09-24 MED ORDER — FENTANYL CITRATE 0.05 MG/ML IJ SOLN
INTRAMUSCULAR | Status: AC
  Administered 2013-09-24: 50 ug
  Filled 2013-09-24: qty 2

## 2013-09-24 MED ORDER — DOXYCYCLINE HYCLATE 100 MG PO TABS
100.0000 mg | ORAL_TABLET | Freq: Once | ORAL | Status: AC
  Administered 2013-09-24: 100 mg via ORAL
  Filled 2013-09-24: qty 1

## 2013-09-24 MED ORDER — FENTANYL CITRATE 0.05 MG/ML IJ SOLN: 50.0000 ug | INTRAMUSCULAR | Status: DC | PRN

## 2013-09-24 MED ORDER — ONDANSETRON HCL 4 MG/2ML IJ SOLN
4.0000 mg | Freq: Once | INTRAMUSCULAR | Status: AC
  Administered 2013-09-24: 4 mg via INTRAVENOUS
  Filled 2013-09-24: qty 2

## 2013-09-24 NOTE — ED Notes (Signed)
Patient yelling out that she is hurting.  C/o pain in her right upper leg.  No shortening noted.  Bilateral lower legs red and swelling.

## 2013-09-24 NOTE — ED Notes (Signed)
[facility] here to transfer patient

## 2013-09-24 NOTE — ED Notes (Signed)
Patient amb slowly approx 12 feet then used the wheelchair to go to the BR and then back to her bed.

## 2013-09-24 NOTE — ED Provider Notes (Signed)
CSN: [id number]     Arrival date & time 09/23/13  2359 History   First MD Initiated Contact with Patient 09/24/13 0140     Chief Complaint  Patient presents with  . Fall   (Consider location/radiation/quality/duration/timing/severity/associated sxs/prior Treatment) HPI History per nursing staff at the extended care facility where patient resides. Staff heard patient fall from a recliner in presents complaining of left hip pain. Patient has history of dementia. She denies any other pain but is unable to recall events tonight or provide any reliable history. Level V caveat applies. She has pain with trying to move her left leg, otherwise moving all extremities. No neck pain.  Past Medical History  Diagnosis Date  . CHF (congestive heart failure)   . Dementia   . Hypertension   . Coronary artery disease   . GERD (gastroesophageal reflux disease)   . Diabetes mellitus   . Atrial fibrillation    No past surgical history on file. No family history on file. History  Substance Use Topics  . Smoking status: Never Smoker   . Smokeless tobacco: Not on file  . Alcohol Use: No   OB History   Grav Para Term Preterm Abortions TAB SAB Ect Mult Living                 Review of Systems  Unable to perform ROS  level V caveat as above  Allergies  Morphine and related  Home Medications   Current Outpatient Rx  Name  Route  Sig  Dispense  Refill  . acetaminophen (TYLENOL) 650 MG CR tablet   Oral   Take 650 mg by mouth 3 (three) times daily. arthritis         . amLODipine (NORVASC) 5 MG tablet   Oral   Take 5 mg by mouth daily.         Marland Kitchen aspirin EC 81 MG tablet   Oral   Take 81 mg by mouth daily.         . cetirizine (ZYRTEC) 10 MG tablet   Oral   Take 10 mg by mouth daily.         . cholecalciferol (VITAMIN D) 1000 UNITS tablet   Oral   Take 1,000 Units by mouth daily.         . divalproex (DEPAKOTE SPRINKLE) 125 MG capsule   Oral   Take 125 mg by mouth at  bedtime.         . docusate sodium (COLACE) 100 MG capsule   Oral   Take 100 mg by mouth at bedtime.         . furosemide (LASIX) 80 MG tablet   Oral   Take 160 mg by mouth 2 (two) times daily.          Marland Kitchen glipiZIDE (GLUCOTROL) 5 MG tablet   Oral   Take 2.5 mg by mouth daily.           Marland Kitchen guaifenesin (ROBITUSSIN) 100 MG/5ML syrup   Oral   Take 600 mg by mouth 4 (four) times daily as needed for cough.         . hydrALAZINE (APRESOLINE) 50 MG tablet   Oral   Take 50 mg by mouth 3 (three) times daily.         . hydrocerin (EUCERIN) CREA   Topical   Apply 1 application topically 2 (two) times daily.         Marland Kitchen HYDROcodone-acetaminophen (NORCO/VICODIN) 5-325 MG per tablet  Oral   Take 1 tablet by mouth at bedtime.         Marland Kitchen HYDROcodone-acetaminophen (NORCO/VICODIN) 5-325 MG per tablet   Oral   Take 0.5 tablets by mouth every 8 (eight) hours as needed for pain.         Marland Kitchen levothyroxine (SYNTHROID, LEVOTHROID) 25 MCG tablet   Oral   Take 25 mcg by mouth daily.          Marland Kitchen loperamide (IMODIUM) 2 MG capsule   Oral   Take 2 mg by mouth daily as needed. For diarrhea         . LORazepam (ATIVAN) 0.5 MG tablet   Oral   Take 0.25 mg by mouth 2 (two) times daily. For anxiety.         . Multiple Vitamins-Minerals (CEROVITE PO)   Oral   Take 1 tablet by mouth daily.         Marland Kitchen nystatin (MYCOSTATIN/NYSTOP) 100000 UNIT/GM POWD   Topical   Apply topically 2 (two) times daily. To breast         . nystatin cream (MYCOSTATIN)   Topical   Apply topically 2 (two) times daily. To breasts         . promethazine (PHENERGAN) 12.5 MG tablet   Oral   Take 12.5 mg by mouth every 6 (six) hours as needed. For nausea.         . psyllium (REGULOID) 0.52 G capsule   Oral   Take 0.52 g by mouth at bedtime.         . traZODone (DESYREL) 50 MG tablet   Oral   Take 75 mg by mouth at bedtime.         Marland Kitchen LORazepam (ATIVAN) 0.5 MG tablet   Oral   Take 0.5 mg  by mouth daily as needed.           BP 146/57  Pulse 74  Temp(Src) 97.6 F (36.4 C) (Oral)  Resp 20  SpO2 97% Physical Exam  Constitutional: She is oriented to person, place, and time. She appears well-developed and well-nourished.  HENT:  Head: Normocephalic and atraumatic.  Eyes: EOM are normal. Pupils are equal, round, and reactive to light.  Neck:  No C-spine tenderness or deformity  Cardiovascular: Normal rate and intact distal pulses.   Pulmonary/Chest: Effort normal and breath sounds normal. No respiratory distress. She exhibits no tenderness.  Abdominal: Soft. There is no tenderness.  Musculoskeletal:  Pelvis stable. Tenderness over her left hip. Symmetric peripheral edema. No tenderness of her knees or ankles. Equal distal pulses. Stage II sacral decub. No tenderness over lumbar or thoracic spine. Mild lateral anterior shin erythema and mild increased warmth to touch. No significant tenderness to those areas.  Neurological: She is alert and oriented to person, place, and time.  Skin: Skin is warm and dry.    ED Course  Procedures (including critical care time) Labs Review Labs Reviewed  GLUCOSE, CAPILLARY - Abnormal; Notable for the following:    Glucose-Capillary 129 (*)    All other components within normal limits  BASIC METABOLIC PANEL - Abnormal; Notable for the following:    Potassium 3.1 (*)    Glucose, Bld 152 (*)    BUN 43 (*)    Creatinine, Ser 1.40 (*)    GFR calc non Af Amer 32 (*)    GFR calc Af Amer 37 (*)    All other components within normal limits  CBC WITH DIFFERENTIAL - Abnormal;  Notable for the following:    WBC 10.9 (*)    Hemoglobin 15.4 (*)    Neutrophils Relative % 82 (*)    Neutro Abs 8.9 (*)    Lymphocytes Relative 10 (*)    All other components within normal limits  PROTIME-INR  TYPE AND SCREEN  ABO/RH   Imaging Review Dg Chest 1 View  09/24/2013   *RADIOLOGY REPORT*  Clinical Data: Generalized chest pain and weakness; runny  nose. Cough.  CHEST - 1 VIEW  Comparison: Chest radiograph performed 07/14/2012, and CTA of the chest performed 07/15/2012  Findings: The lungs are well-aerated.  The density at the right lung base appears to reflect a portion of the liver, on correlation with prior CT.  Vascular congestion is noted, without definite pulmonary edema.  There is no evidence of pleural effusion or pneumothorax.  The heart is enlarged.  No acute osseous abnormalities are seen.  IMPRESSION: Vascular congestion and cardiomegaly, without definite pulmonary edema.   Original Report Authenticated By: Tonia Ghent, M.D.   Dg Lumbar Spine Complete  09/24/2013   *RADIOLOGY REPORT*  Clinical Data: Status post fall; lower back pain.  LUMBAR SPINE - COMPLETE 4+ VIEW  Comparison: CT of the abdomen and pelvis performed 06/10/2012  Findings: There is no evidence of fracture or subluxation. Vertebral bodies demonstrate normal height and alignment. Intervertebral disc spaces are preserved.  The visualized neural foramina are grossly unremarkable in appearance.  The visualized bowel gas pattern is unremarkable in appearance; air and stool are noted within the colon.  The sacroiliac joints are within normal limits.  Diffuse vascular calcifications are seen.  IMPRESSION:  1.  No evidence of fracture or subluxation along the lumbar spine. 2.  Diffuse vascular calcifications seen.   Original Report Authenticated By: Tonia Ghent, M.D.   Dg Hip Complete Left  09/24/2013   *RADIOLOGY REPORT*  Clinical Data: Status post fall; left hip pain.  LEFT HIP - COMPLETE 2+ VIEW  Comparison: None.  Findings: There is no evidence of fracture or dislocation.  Both femoral heads are seated normally within their respective acetabula.  The proximal left femur appears intact.  No significant degenerative change is appreciated.  The sacroiliac joints are unremarkable in appearance.  The visualized bowel gas pattern is grossly unremarkable in appearance.  Scattered  phleboliths are noted within the pelvis.  IMPRESSION: No evidence of fracture or dislocation.   Original Report Authenticated By: Tonia Ghent, M.D.   Ct Head Wo Contrast  09/24/2013   *RADIOLOGY REPORT*  Clinical Data:  Fall former plantar.  CT HEAD WITHOUT CONTRAST CT CERVICAL SPINE WITHOUT CONTRAST  Technique:  Multidetector CT imaging of the head and cervical spine was performed following the standard protocol without intravenous contrast.  Multiplanar CT image reconstructions of the cervical spine were also generated.  Comparison:  CT of head and cervical spine Apr 25, 2013.  CT HEAD  Findings: The ventricles and sulci are normal for age.  No intraparenchymal hemorrhage, mass effect nor midline shift.  Patchy supratentorial white matter hypodensities are within normal range for patient's age and though non-specific suggest sequalae of chronic small vessel ischemic disease. No acute large vascular territory infarcts. Ovoid hypodensity in right inferior cerebellum is unchanged most consistent with remote infarct.  Motion degree examination, images reattempted with mild image quality improvement.  No abnormal extra-axial fluid collections.  Basal cisterns are patent. Moderate calcific atherosclerosis of the carotid siphons.  No skull fracture.  Visualized paranasal sinuses and mastoid aircells are well-aerated.  The included ocular globes and orbital contents are non-suspicious.  IMPRESSION: No acute intracranial process; stable appearance of the head from Apr 25, 2013 including moderate white matter change changes suggestive chronic small vessel ischemic disease, involutional changes.  CT CERVICAL SPINE  Findings: Cervical vertebral bodies and post elements appear intact, maintenance of the lumbar lordosis.  Grade 1 C5-6 anterolisthesis on degenerative change at the bases, unchanged. Severe C5-6 and C6-7 disc height loss, with endplate sclerosis and ventral spurring from C3-4 through C 71.  No destructive  bony lesions.  Severe at C1-2 osteoarthrosis.  Lateral masses and alignment.  Mild calcific atherosclerosis of the carotid bulbs.  Small broad-based disc osteophyte complex, facet arthropathy without canal stenosis.  Neural foraminal narrowing C3-4 through C6- 7, severe from C3-4 through C5-6.  IMPRESSION: No acute fracture.  Stable grade 1 C5-6 anterolisthesis on spondylotic basis. Degenerative change of the cervical spine, without canal stenosis. Severe neural foraminal narrowing C3-4 through C5-6.   Original Report Authenticated By: Awilda Metro   Ct Cervical Spine Wo Contrast  09/24/2013   *RADIOLOGY REPORT*  Clinical Data:  Fall former plantar.  CT HEAD WITHOUT CONTRAST CT CERVICAL SPINE WITHOUT CONTRAST  Technique:  Multidetector CT imaging of the head and cervical spine was performed following the standard protocol without intravenous contrast.  Multiplanar CT image reconstructions of the cervical spine were also generated.  Comparison:  CT of head and cervical spine Apr 25, 2013.  CT HEAD  Findings: The ventricles and sulci are normal for age.  No intraparenchymal hemorrhage, mass effect nor midline shift.  Patchy supratentorial white matter hypodensities are within normal range for patient's age and though non-specific suggest sequalae of chronic small vessel ischemic disease. No acute large vascular territory infarcts. Ovoid hypodensity in right inferior cerebellum is unchanged most consistent with remote infarct.  Motion degree examination, images reattempted with mild image quality improvement.  No abnormal extra-axial fluid collections.  Basal cisterns are patent. Moderate calcific atherosclerosis of the carotid siphons.  No skull fracture.  Visualized paranasal sinuses and mastoid aircells are well-aerated.  The included ocular globes and orbital contents are non-suspicious.  IMPRESSION: No acute intracranial process; stable appearance of the head from Apr 25, 2013 including moderate white  matter change changes suggestive chronic small vessel ischemic disease, involutional changes.  CT CERVICAL SPINE  Findings: Cervical vertebral bodies and post elements appear intact, maintenance of the lumbar lordosis.  Grade 1 C5-6 anterolisthesis on degenerative change at the bases, unchanged. Severe C5-6 and C6-7 disc height loss, with endplate sclerosis and ventral spurring from C3-4 through C 71.  No destructive bony lesions.  Severe at C1-2 osteoarthrosis.  Lateral masses and alignment.  Mild calcific atherosclerosis of the carotid bulbs.  Small broad-based disc osteophyte complex, facet arthropathy without canal stenosis.  Neural foraminal narrowing C3-4 through C6- 7, severe from C3-4 through C5-6.  IMPRESSION: No acute fracture.  Stable grade 1 C5-6 anterolisthesis on spondylotic basis. Degenerative change of the cervical spine, without canal stenosis. Severe neural foraminal narrowing C3-4 through C5-6.   Original Report Authenticated By: Awilda Metro     Date: 09/24/2013  Rate: 68  Rhythm: atrial fibrillation  QRS Axis: normal  Intervals: normal  ST/T Wave abnormalities: nonspecific ST/T changes  Conduction Disutrbances:right bundle branch block  Narrative Interpretation:   Old EKG Reviewed: none available  IV fentanyl Zofran pain control   5:07 AM ambulates with assistance. Now denies any left leg pain and complains of some right anterior thigh discomfort. She  also now has some lumbar pain with sacral decub noted. Plain films of lumbar spine negative for acute fracture. Plan discharge back to facility with prescription for antibiotics for mild cellulitis bilateral extremities. Potassium provided for hypokalemia. Plan followup with primary care physician in 2 days for recheck labs and followup symptoms.  MDM  Diagnosis: Fall, cellulitis lower extremities, hypokalemia  Pain improved IV narcotics Evaluated with labs and imaging an EKG Serial evaluations, multiple complaints./  unreliable historian Vital signs and nursing notes reviewed and considered   Sunnie Nielsen, MD 09/24/13 [phone]

## 2013-09-24 NOTE — ED Notes (Signed)
After giving her Fentanyl her sats began to drop  Oxygen applied

## 2013-09-24 NOTE — ED Notes (Signed)
Report called to [facility] at [facility]

## 2013-09-24 NOTE — ED Notes (Signed)
From [facility] off [address]  Staff, "she fell, they heard her saying I hurt."  Pain is in rt. Upper leg. Fell from [employer]

## 2013-10-15 ENCOUNTER — Encounter (HOSPITAL_COMMUNITY): Payer: Self-pay | Admitting: Emergency Medicine

## 2013-10-15 ENCOUNTER — Emergency Department (HOSPITAL_COMMUNITY): Payer: Medicare Other

## 2013-10-15 ENCOUNTER — Emergency Department (HOSPITAL_COMMUNITY)
Admission: EM | Admit: 2013-10-15 | Discharge: 2013-10-15 | Disposition: A | Discharge status: Other Acute Inpatient Hospital | Payer: Medicare Other | Attending: Emergency Medicine | Admitting: Emergency Medicine

## 2013-10-15 DIAGNOSIS — K219 Gastro-esophageal reflux disease without esophagitis: Secondary | ICD-10-CM | POA: Insufficient documentation

## 2013-10-15 DIAGNOSIS — R109 Unspecified abdominal pain: Secondary | ICD-10-CM | POA: Insufficient documentation

## 2013-10-15 DIAGNOSIS — I2510 Atherosclerotic heart disease of native coronary artery without angina pectoris: Secondary | ICD-10-CM | POA: Insufficient documentation

## 2013-10-15 DIAGNOSIS — N390 Urinary tract infection, site not specified: Secondary | ICD-10-CM | POA: Insufficient documentation

## 2013-10-15 DIAGNOSIS — E119 Type 2 diabetes mellitus without complications: Secondary | ICD-10-CM | POA: Insufficient documentation

## 2013-10-15 DIAGNOSIS — I509 Heart failure, unspecified: Secondary | ICD-10-CM

## 2013-10-15 DIAGNOSIS — F0390 Unspecified dementia without behavioral disturbance: Secondary | ICD-10-CM | POA: Insufficient documentation

## 2013-10-15 DIAGNOSIS — Z7982 Long term (current) use of aspirin: Secondary | ICD-10-CM | POA: Insufficient documentation

## 2013-10-15 DIAGNOSIS — Z79899 Other long term (current) drug therapy: Secondary | ICD-10-CM | POA: Insufficient documentation

## 2013-10-15 DIAGNOSIS — I10 Essential (primary) hypertension: Secondary | ICD-10-CM | POA: Insufficient documentation

## 2013-10-15 DIAGNOSIS — E876 Hypokalemia: Secondary | ICD-10-CM | POA: Insufficient documentation

## 2013-10-15 LAB — COMPREHENSIVE METABOLIC PANEL
Alkaline Phosphatase: 63 U/L (ref 39–117)
BUN: 40 mg/dL — ABNORMAL HIGH (ref 6–23)
CO2: 23 mEq/L (ref 19–32)
Chloride: 98 mEq/L (ref 96–112)
Creatinine, Ser: 1.27 mg/dL — ABNORMAL HIGH (ref 0.50–1.10)
GFR calc Af Amer: 42 mL/min — ABNORMAL LOW (ref >90)
GFR calc non Af Amer: 36 mL/min — ABNORMAL LOW (ref >90)
Glucose, Bld: 137 mg/dL — ABNORMAL HIGH (ref 70–99)
Sodium: 138 mEq/L (ref 135–145)
Total Bilirubin: 0.6 mg/dL (ref 0.3–1.2)

## 2013-10-15 LAB — BASIC METABOLIC PANEL
BUN: 39 mg/dL — ABNORMAL HIGH (ref 6–23)
CO2: 19 mEq/L (ref 19–32)
Calcium: 9 mg/dL (ref 8.4–10.5)
Chloride: 102 mEq/L (ref 96–112)
Creatinine, Ser: 1.16 mg/dL — ABNORMAL HIGH (ref 0.50–1.10)
GFR calc Af Amer: 47 mL/min — ABNORMAL LOW (ref >90)
GFR calc non Af Amer: 40 mL/min — ABNORMAL LOW (ref >90)
Sodium: 137 mEq/L (ref 135–145)

## 2013-10-15 LAB — URINALYSIS, ROUTINE W REFLEX MICROSCOPIC
Bilirubin Urine: NEGATIVE
Glucose, UA: NEGATIVE mg/dL
Ketones, ur: NEGATIVE mg/dL
Nitrite: POSITIVE — AB
Protein, ur: NEGATIVE mg/dL
Urobilinogen, UA: 0.2 mg/dL (ref 0.0–1.0)

## 2013-10-15 LAB — URINE MICROSCOPIC-ADD ON

## 2013-10-15 LAB — CBC WITH DIFFERENTIAL/PLATELET
Basophils Absolute: 0 10*3/uL (ref 0.0–0.1)
Eosinophils Absolute: 0.1 10*3/uL (ref 0.0–0.7)
Eosinophils Relative: 1 % (ref 0–5)
Lymphocytes Relative: 9 % — ABNORMAL LOW (ref 12–46)
MCH: 33.3 pg (ref 26.0–34.0)
MCHC: 34.9 g/dL (ref 30.0–36.0)
MCV: 95.2 fL (ref 78.0–100.0)
Monocytes Absolute: 1.2 10*3/uL — ABNORMAL HIGH (ref 0.1–1.0)
Platelets: 202 10*3/uL (ref 150–400)
RDW: 14.9 % (ref 11.5–15.5)
WBC: 12 10*3/uL — ABNORMAL HIGH (ref 4.0–10.5)

## 2013-10-15 LAB — TROPONIN I: Troponin I: <0.3 ng/mL (ref <0.30)

## 2013-10-15 LAB — VALPROIC ACID LEVEL: Valproic Acid Lvl: 10.6 ug/mL — ABNORMAL LOW (ref 50.0–100.0)

## 2013-10-15 LAB — PROTIME-INR
INR: 1.06 (ref 0.00–1.49)
Prothrombin Time: 13.6 seconds (ref 11.6–15.2)

## 2013-10-15 LAB — PRO B NATRIURETIC PEPTIDE: Pro B Natriuretic peptide (BNP): 3549 pg/mL — ABNORMAL HIGH (ref 0–450)

## 2013-10-15 MED ORDER — DEXTROSE 5 % IV SOLN
1.0000 g | INTRAVENOUS | Status: DC
  Administered 2013-10-15: 1 g via INTRAVENOUS
  Filled 2013-10-15: qty 10

## 2013-10-15 MED ORDER — POTASSIUM CHLORIDE 10 MEQ/100ML IV SOLN
10.0000 meq | Freq: Once | INTRAVENOUS | Status: AC
  Administered 2013-10-15: 10 meq via INTRAVENOUS
  Filled 2013-10-15: qty 100

## 2013-10-15 MED ORDER — CEPHALEXIN 500 MG PO CAPS: 500.0000 mg | ORAL_CAPSULE | Freq: Four times a day (QID) | ORAL | Status: DC

## 2013-10-15 MED ORDER — CLOTRIMAZOLE 1 % EX CREA: TOPICAL_CREAM | CUTANEOUS | Status: AC

## 2013-10-15 MED ORDER — POTASSIUM CHLORIDE ER 10 MEQ PO TBCR: 10.0000 meq | EXTENDED_RELEASE_TABLET | Freq: Two times a day (BID) | ORAL | Status: AC

## 2013-10-15 MED ORDER — POTASSIUM CHLORIDE CRYS ER 20 MEQ PO TBCR
60.0000 meq | EXTENDED_RELEASE_TABLET | Freq: Once | ORAL | Status: AC
  Administered 2013-10-15: 60 meq via ORAL
  Filled 2013-10-15: qty 3

## 2013-10-15 MED ORDER — SODIUM CHLORIDE 0.9 % IV SOLN
INTRAVENOUS | Status: DC
  Administered 2013-10-15: 09:00:00 via INTRAVENOUS

## 2013-10-15 MED ORDER — ACETAMINOPHEN 325 MG PO TABS
650.0000 mg | ORAL_TABLET | Freq: Once | ORAL | Status: AC
  Administered 2013-10-15: 650 mg via ORAL
  Filled 2013-10-15: qty 2

## 2013-10-15 MED ORDER — POTASSIUM CHLORIDE CRYS ER 20 MEQ PO TBCR: 40.0000 meq | EXTENDED_RELEASE_TABLET | Freq: Once | ORAL | Status: DC

## 2013-10-15 MED ORDER — FUROSEMIDE 10 MG/ML IJ SOLN
60.0000 mg | Freq: Once | INTRAMUSCULAR | Status: AC
  Administered 2013-10-15: 60 mg via INTRAVENOUS
  Filled 2013-10-15: qty 6

## 2013-10-15 NOTE — ED Notes (Signed)
Pt reports continued pain. MD made aware. Pt repositioned with relief. Pt also states food will help her. Meal tray ordered. VSS.

## 2013-10-15 NOTE — ED Notes (Signed)
Admitting MD at bedside.

## 2013-10-15 NOTE — ED Notes (Signed)
Patient transported to X-ray 

## 2013-10-15 NOTE — ED Notes (Signed)
Pt to department via [facility] from [facility]. Staff reports the patient had lab work draw yesterday and reports that her BUN and INR are abnormal. Pt reports pain in her buttocks from a ulcer. Hr-68 Bp-152/92 No distress noted on arrival.

## 2013-10-15 NOTE — ED Provider Notes (Addendum)
CSN: [id number]     Arrival date & time 10/15/13  0840 History   First MD Initiated Contact with Patient 10/15/13 0840     Chief Complaint  Patient presents with  . Abnormal Lab   (Consider location/radiation/quality/duration/timing/severity/associated sxs/prior Treatment) The history is provided by the patient and medical records. The history is limited by the condition of the patient.   patient here with decreased level of consciousness as well as abnormal labs from the nursing home. Patient has a baseline history of dementia and therefore the history is limited. I reviewed the lab work from nursing home and she has a creatinine 1.45 a BUN of 40. Patient notes crampy abdominal pain without vomiting or fever or diarrhea. Symptoms have been persistent. She is difficult historian due to the dementia. EMS was called and patient transported here  Past Medical History  Diagnosis Date  . CHF (congestive heart failure)   . Dementia   . Hypertension   . Coronary artery disease   . GERD (gastroesophageal reflux disease)   . Diabetes mellitus   . Atrial fibrillation    History reviewed. No pertinent past surgical history. History reviewed. No pertinent family history. History  Substance Use Topics  . Smoking status: Never Smoker   . Smokeless tobacco: Not on file  . Alcohol Use: No   OB History   Grav Para Term Preterm Abortions TAB SAB Ect Mult Living                 Review of Systems  All other systems reviewed and are negative.    Allergies  Morphine and related  Home Medications   Current Outpatient Rx  Name  Route  Sig  Dispense  Refill  . acetaminophen (TYLENOL) 650 MG CR tablet   Oral   Take 650 mg by mouth 3 (three) times daily. arthritis         . amLODipine (NORVASC) 5 MG tablet   Oral   Take 5 mg by mouth daily.         Marland Kitchen aspirin EC 81 MG tablet   Oral   Take 81 mg by mouth daily.         . cetirizine (ZYRTEC) 10 MG tablet   Oral   Take 10 mg by  mouth daily.         . cholecalciferol (VITAMIN D) 1000 UNITS tablet   Oral   Take 1,000 Units by mouth daily.         . divalproex (DEPAKOTE SPRINKLE) 125 MG capsule   Oral   Take 125 mg by mouth at bedtime.         . docusate sodium (COLACE) 100 MG capsule   Oral   Take 100 mg by mouth at bedtime.         Marland Kitchen doxycycline (VIBRAMYCIN) 100 MG capsule   Oral   Take 1 capsule (100 mg total) by mouth 2 (two) times daily.   20 capsule   0   . furosemide (LASIX) 80 MG tablet   Oral   Take 160 mg by mouth 2 (two) times daily.          Marland Kitchen glipiZIDE (GLUCOTROL) 5 MG tablet   Oral   Take 2.5 mg by mouth daily.           Marland Kitchen guaifenesin (ROBITUSSIN) 100 MG/5ML syrup   Oral   Take 600 mg by mouth 4 (four) times daily as needed for cough.         Marland Kitchen  hydrALAZINE (APRESOLINE) 50 MG tablet   Oral   Take 50 mg by mouth 3 (three) times daily.         . hydrocerin (EUCERIN) CREA   Topical   Apply 1 application topically 2 (two) times daily.         Marland Kitchen HYDROcodone-acetaminophen (NORCO/VICODIN) 5-325 MG per tablet   Oral   Take 1 tablet by mouth at bedtime.         Marland Kitchen HYDROcodone-acetaminophen (NORCO/VICODIN) 5-325 MG per tablet   Oral   Take 0.5 tablets by mouth every 8 (eight) hours as needed for pain.         Marland Kitchen levothyroxine (SYNTHROID, LEVOTHROID) 25 MCG tablet   Oral   Take 25 mcg by mouth daily.          Marland Kitchen loperamide (IMODIUM) 2 MG capsule   Oral   Take 2 mg by mouth daily as needed. For diarrhea         . LORazepam (ATIVAN) 0.5 MG tablet   Oral   Take 0.25 mg by mouth 2 (two) times daily. For anxiety.         Marland Kitchen LORazepam (ATIVAN) 0.5 MG tablet   Oral   Take 0.5 mg by mouth daily as needed.          . Multiple Vitamins-Minerals (CEROVITE PO)   Oral   Take 1 tablet by mouth daily.         Marland Kitchen nystatin (MYCOSTATIN/NYSTOP) 100000 UNIT/GM POWD   Topical   Apply topically 2 (two) times daily. To breast         . nystatin cream  (MYCOSTATIN)   Topical   Apply topically 2 (two) times daily. To breasts         . promethazine (PHENERGAN) 12.5 MG tablet   Oral   Take 12.5 mg by mouth every 6 (six) hours as needed. For nausea.         . psyllium (REGULOID) 0.52 G capsule   Oral   Take 0.52 g by mouth at bedtime.         . traZODone (DESYREL) 50 MG tablet   Oral   Take 75 mg by mouth at bedtime.          BP 166/77  Temp(Src) 97.4 F (36.3 C) (Oral)  Resp 22  SpO2 94% Physical Exam  Nursing note and vitals reviewed. Constitutional: She is oriented to person, place, and time. She appears well-developed and well-nourished.  Non-toxic appearance. No distress.  HENT:  Head: Normocephalic and atraumatic.  Eyes: Conjunctivae, EOM and lids are normal. Pupils are equal, round, and reactive to light.  Neck: Normal range of motion. Neck supple. No tracheal deviation present. No mass present.  Cardiovascular: Normal rate, regular rhythm and normal heart sounds.  Exam reveals no gallop.   No murmur heard. Pulmonary/Chest: Effort normal and breath sounds normal. No stridor. No respiratory distress. She has no decreased breath sounds. She has no wheezes. She has no rhonchi. She has no rales.  Abdominal: Soft. Normal appearance and bowel sounds are normal. She exhibits no distension. There is no tenderness. There is no rebound and no CVA tenderness.  Musculoskeletal: Normal range of motion. She exhibits no edema and no tenderness.  Neurological: She is alert and oriented to person, place, and time. She has normal strength. No cranial nerve deficit or sensory deficit. GCS eye subscore is 4. GCS verbal subscore is 5. GCS motor subscore is 6.  Skin: Skin is warm  and dry. No abrasion and no rash noted.  Psychiatric: Her affect is blunt. Her speech is delayed. She is slowed.    ED Course  Procedures (including critical care time) Labs Review Labs Reviewed - No data to display Imaging Review No results found.  EKG  Interpretation     Ventricular Rate:  70 PR Interval:    QRS Duration: 136 QT Interval:  473 QTC Calculation: 510 R Axis:   49 Text Interpretation:  Atrial fibrillation Right bundle branch block Anteroseptal infarct, age indeterminate No significant change since last tracing            MDM  No diagnosis found. Patient's hypokalemia noted and was treated with oral and IV potassium. BNP elevation of his well. Repeat potassium level is stabilized the patient will require admission for CHF.    Toy Baker, MD 10/15/13 1343  Toy Baker, MD 10/15/13 [id number]

## 2013-10-15 NOTE — ED Notes (Signed)
Pt wating for transport home

## 2013-10-15 NOTE — Consult Note (Signed)
Family Medicine Teaching [facility] Consult Note Service Pager: [phone]  Patient name: Isabella Mccoy Medical record number: [id number] Date of birth: 05-27-1922 Age: 79 y.o. Gender: female  Primary Care Provider: Florentina Jenny, MD Code Status: Not on file  Chief Complaint: Concern for cellulitis  Assessment and Plan: REMEDY CORPORAN is a 79 y.o. female presenting to the [facility] ED with a subacute course of cellulitis and confusion above baseline. PMH is significant for CHF, CAD, HTN, AFib, diabetes mellitus, and dementia.   # Shortness of breath. Patient has no oxygen requirement, saturating >95% on room air, and appears to be in no respiratory distress. BNP 3549, CXR with increased vascular congestion but no crackles noted on exam. Troponin is negative. ECG with AFib, normal rate, RBBB, similar to previous. With h/o CHF and report of increased LE edema today, ED provider to dose lasix IV 60mg  x 1 in ED prior to discharge and then resume home lasix 80mg  PO BID. Last echo 08/2003 in [facility] showing EF 55-65%, atrial fibrillation, and other mild abnormalities (mildly increased left ventricular wall thickness, mildly dilated left atrium with increased pressure, right ventricle, right atrium, and small pericardial effusion). Advise following patient's weight on a regular basis to monitor for fluid overload and to call PCP with weight gain >3 lbs. Discussed importance of regular PCP follow up with Duwayne Heck (pt's nurse at ALF).   # Gluteal wound. Appears possibly candidal. Advise barrier cream, frequent repositioning and donut cushion to relieve pressure, and lotrasone cream for fungal infection. Asked [facility] RN to look into donut cushion prior to discharge if one available here, but they were unsure if this is possible.   # Urinary tract infection found on urinalysis. Mild leukocytosis to 12, afebrile. Received rocephin 1g in ED. The patient is not complaining of dysuria, frequency, or hesitancy  and therefore no treatment is indicated at this time.   # Hypokalemia, resolved after oral repletion (2.6 to 3.6). This should be monitored and managed as outpatient.   # Elevated creatinine: 1.27 to 1.16 after gentle hydration. Baseline appears to be about 1.40. Renal function will support some diuresis. Advise IV lasix x1 prior to discharge from ED, then continue home lasix regimen and have close PCP follow up with repeat BMET. Per ALF RN, pt is able to maintain normal PO.  # Atrial fibrillation: Patient is rate controlled and not on anticoagulation. Uncertain reason but likely due to fall risk with dementia and seizure hx. PCP to further evaluate and manage.  # Decreased responsiveness per ALF RN: Patient was alert and oriented in ED, with possible underlying mild dementia but otherwise no decreased responsiveness while here.   Disposition: Discharge to assisted living facility with close PCP follow up on Tuesday.    History of Present Illness: Isabella Mccoy is a 79 y.o. female presenting to the [facility] ED with a subacute course of cellulitis and confusion above baseline. PMH is significant for CHF, CAD, HTN, AFib, diabetes mellitus, and dementia.   Ms. Shelburne was sent from ALF for concern of difficulty with arousal this morning  and cellulitis with lower extremity edema with small blisters. The RN placed bilateral TED hose at that time. On discussion with her RN Duwayne Heck, she has not had fever/chills. She has been eating normally.  In the ED she was found to be alert and oriented, and have an elevated BNP and a chest x-ray with pulmonary vascular congestion. She says she doesn't feel any more short  of breath than baseline. Her chief complaint is "my butt hurts," due to gluteal wound and "they told me I have a leg infection."  Review Of Systems: Per HPI  Otherwise 12 point review of systems was performed and was unremarkable.  There are no active problems to display for this  patient.  Past Medical History: Past Medical History  Diagnosis Date  . CHF (congestive heart failure)   . Dementia   . Hypertension   . Coronary artery disease   . GERD (gastroesophageal reflux disease)   . Diabetes mellitus   . Atrial fibrillation    Past Surgical History: History reviewed. No pertinent past surgical history. Social History: History  Substance Use Topics  . Smoking status: Never Smoker   . Smokeless tobacco: Not on file  . Alcohol Use: No   Additional social history: Lives in ALF Please also refer to relevant sections of EMR.  Family History: History reviewed. No pertinent family history. Allergies and Medications: Allergies  Allergen Reactions  . Morphine And Related Itching   No current facility-administered medications on file prior to encounter.   Current Outpatient Prescriptions on File Prior to Encounter  Medication Sig Dispense Refill  . acetaminophen (TYLENOL) 650 MG CR tablet Take 650 mg by mouth 3 (three) times daily. arthritis      . amLODipine (NORVASC) 5 MG tablet Take 5 mg by mouth daily.      Marland Kitchen aspirin EC 81 MG tablet Take 81 mg by mouth daily.      . cetirizine (ZYRTEC) 10 MG tablet Take 10 mg by mouth daily.      . cholecalciferol (VITAMIN D) 1000 UNITS tablet Take 1,000 Units by mouth daily.      . divalproex (DEPAKOTE SPRINKLE) 125 MG capsule Take 125 mg by mouth at bedtime.      . docusate sodium (COLACE) 100 MG capsule Take 100 mg by mouth at bedtime.      . furosemide (LASIX) 80 MG tablet Take 160 mg by mouth 2 (two) times daily.       Marland Kitchen glipiZIDE (GLUCOTROL) 5 MG tablet Take 2.5 mg by mouth daily.        Marland Kitchen guaifenesin (ROBITUSSIN) 100 MG/5ML syrup Take 600 mg by mouth every 6 (six) hours as needed for cough.       . hydrALAZINE (APRESOLINE) 50 MG tablet Take 50 mg by mouth 3 (three) times daily.      . hydrocerin (EUCERIN) CREA Apply 1 application topically 2 (two) times daily.      Marland Kitchen HYDROcodone-acetaminophen (NORCO/VICODIN)  5-325 MG per tablet Take 0.5-1 tablets by mouth See admin instructions. Standing Order: Take 1 tablet daily at Bedtime PRN Order: Take 0.5 tablet daily as needed for pain.      Marland Kitchen levothyroxine (SYNTHROID, LEVOTHROID) 25 MCG tablet Take 37.5 mcg by mouth daily.       Marland Kitchen loperamide (IMODIUM) 2 MG capsule Take 2-4 mg by mouth as needed for diarrhea or loose stools (Take 2 capsules by mouth after First Loose Stool then take 1 capsule by mouth As Needed for each Additional Loose Stool). For diarrhea      . Multiple Vitamins-Minerals (CEROVITE PO) Take 1 tablet by mouth daily.      Marland Kitchen nystatin (MYCOSTATIN/NYSTOP) 100000 UNIT/GM POWD Apply topically 2 (two) times daily. To breast      . nystatin cream (MYCOSTATIN) Apply topically 2 (two) times daily. To breasts      . promethazine (PHENERGAN) 12.5 MG tablet Take  12.5 mg by mouth every 6 (six) hours as needed. For nausea.      . psyllium (REGULOID) 0.52 G capsule Take 0.52 g by mouth at bedtime.      . traZODone (DESYREL) 50 MG tablet Take 50 mg by mouth at bedtime.         Objective: BP 145/50  Pulse 76  Temp(Src) 97.5 F (36.4 C) (Oral)  Resp 24  SpO2 97% Exam: General: Elderly woman sitting in bed, stating she is hungry, in no distress HEENT: Anicteric sclerae, arcus senilis, oropharynx clear Cardiovascular: irregularly irregular, no M/R/G, no JVD Respiratory: Non-labored on room air, CTAB without crackles Abdomen: Soft, obese, NT, ND, no suprapubic tenderness Extremities: Lower extremities with TED hose on bilaterally, tender to palpation generalized anterior and posterior lower legs bilaterally, trace LE edema. 2+ DP pulses.  Skin: Open wound in the gluteal cleft with surrounding erythema.  Neuro: AAOx3, CN II-XII intact grossly, speech normal, moderate trunk tone able to sit up on her own using arms  Labs and Imaging: CBC BMET   Recent Labs Lab 10/15/13 0903  WBC 12.0*  HGB 16.6*  HCT 47.5*  PLT 202    Recent Labs Lab  10/15/13 1145  NA 137  K 3.6  CL 102  CO2 19  BUN 39*  CREATININE 1.16*  GLUCOSE 131*  CALCIUM 9.0      10/15/2013 10:05  Color, Urine YELLOW  APPearance CLOUDY (A)  Specific Gravity, Urine 1.013  pH 6.0  Glucose NEGATIVE  Bilirubin Urine NEGATIVE  Ketones, ur NEGATIVE  Protein NEGATIVE  Urobilinogen, UA 0.2  Nitrite POSITIVE (A)  Leukocytes, UA SMALL (A)  Hgb urine dipstick NEGATIVE  WBC, UA 7-10  RBC / HPF 0-2  Squamous Epithelial / LPF RARE  Bacteria, UA MANY (A)   CT HEAD W/O CONTRAST FINDINGS:  Moderate diffuse atrophy is stable. There is no mass, hemorrhage,  extra-axial fluid collection, or midline shift. There is evidence of  a prior infarct in the superior right globus pallidum the, stable.  There is evidence of a prior small infarct in the periphery of the  mid right cerebellum the posteriorly. There is patchy small vessel  disease in the centra semiovale bilaterally, stable. No new  gray-white compartment lesion is appreciated. The no acute infarct  apparent.  Bony calvarium appears intact. The mastoid air cells are clear.  IMPRESSION:  Moderate diffuse atrophy with prior infarcts and small vessel  disease as described. No intracranial mass, hemorrhage, or acute  appearing infarct.  ECG: AFib, Vent. Rate 70, RBBB, similar to previous  Hazeline Junker, MD 10/15/2013, 2:18 PM PGY-1, [facility] Family Medicine FPTS Intern pager: [phone], text pages welcome    I have seen and examined patient with Dr Jarvis Newcomer and agree with management above. My additions are in blue.  Leona Singleton, MD  10/15/2013 4:02 PM PGY-2,  Family Medicine

## 2013-10-15 NOTE — ED Notes (Signed)
Patient having iv placed by nurses.  Xray is waiting to take patient.  Urine will be retrieved when patient returns.

## 2013-10-16 NOTE — Consult Note (Signed)
FMTS Attending Note: Denny Levy MD [phone] pager office [phone] of informed consent is placed in shadow chart. I was present for entire procedure.Appropriate time out taken. No complications.  Path pending. Hemostasis obtained simple pressure and small amount silver nitrate.

## 2013-10-19 LAB — URINE CULTURE

## 2013-10-19 NOTE — ED Notes (Signed)
+   urine No treatment needed per Kaitlyn  Szekalski 

## 2015-03-22 ENCOUNTER — Encounter (HOSPITAL_COMMUNITY): Payer: Self-pay | Admitting: Emergency Medicine

## 2015-03-22 ENCOUNTER — Emergency Department (HOSPITAL_COMMUNITY)
Admission: EM | Admit: 2015-03-22 | Discharge: 2015-03-22 | Disposition: A | Discharge status: Home / Self Care | Payer: Medicare Other | Attending: Emergency Medicine | Admitting: Emergency Medicine

## 2015-03-22 ENCOUNTER — Emergency Department (HOSPITAL_COMMUNITY): Payer: Medicare Other

## 2015-03-22 DIAGNOSIS — S99921A Unspecified injury of right foot, initial encounter: Secondary | ICD-10-CM | POA: Diagnosis present

## 2015-03-22 DIAGNOSIS — Y998 Other external cause status: Secondary | ICD-10-CM | POA: Diagnosis not present

## 2015-03-22 DIAGNOSIS — I2510 Atherosclerotic heart disease of native coronary artery without angina pectoris: Secondary | ICD-10-CM | POA: Insufficient documentation

## 2015-03-22 DIAGNOSIS — K219 Gastro-esophageal reflux disease without esophagitis: Secondary | ICD-10-CM | POA: Insufficient documentation

## 2015-03-22 DIAGNOSIS — Z7982 Long term (current) use of aspirin: Secondary | ICD-10-CM | POA: Diagnosis not present

## 2015-03-22 DIAGNOSIS — Z79899 Other long term (current) drug therapy: Secondary | ICD-10-CM | POA: Diagnosis not present

## 2015-03-22 DIAGNOSIS — S90121A Contusion of right lesser toe(s) without damage to nail, initial encounter: Secondary | ICD-10-CM | POA: Insufficient documentation

## 2015-03-22 DIAGNOSIS — W1839XA Other fall on same level, initial encounter: Secondary | ICD-10-CM | POA: Diagnosis not present

## 2015-03-22 DIAGNOSIS — Y9389 Activity, other specified: Secondary | ICD-10-CM | POA: Insufficient documentation

## 2015-03-22 DIAGNOSIS — E119 Type 2 diabetes mellitus without complications: Secondary | ICD-10-CM | POA: Diagnosis not present

## 2015-03-22 DIAGNOSIS — W19XXXA Unspecified fall, initial encounter: Secondary | ICD-10-CM

## 2015-03-22 DIAGNOSIS — F0390 Unspecified dementia without behavioral disturbance: Secondary | ICD-10-CM | POA: Diagnosis not present

## 2015-03-22 DIAGNOSIS — Z792 Long term (current) use of antibiotics: Secondary | ICD-10-CM | POA: Diagnosis not present

## 2015-03-22 DIAGNOSIS — Y9289 Other specified places as the place of occurrence of the external cause: Secondary | ICD-10-CM | POA: Insufficient documentation

## 2015-03-22 DIAGNOSIS — S92401A Displaced unspecified fracture of right great toe, initial encounter for closed fracture: Secondary | ICD-10-CM

## 2015-03-22 DIAGNOSIS — M25551 Pain in right hip: Secondary | ICD-10-CM

## 2015-03-22 DIAGNOSIS — I509 Heart failure, unspecified: Secondary | ICD-10-CM | POA: Insufficient documentation

## 2015-03-22 DIAGNOSIS — S92414A Nondisplaced fracture of proximal phalanx of right great toe, initial encounter for closed fracture: Secondary | ICD-10-CM | POA: Diagnosis not present

## 2015-03-22 DIAGNOSIS — S79911A Unspecified injury of right hip, initial encounter: Secondary | ICD-10-CM | POA: Insufficient documentation

## 2015-03-22 DIAGNOSIS — I10 Essential (primary) hypertension: Secondary | ICD-10-CM | POA: Insufficient documentation

## 2015-03-22 DIAGNOSIS — S90129A Contusion of unspecified lesser toe(s) without damage to nail, initial encounter: Secondary | ICD-10-CM

## 2015-03-22 NOTE — ED Provider Notes (Signed)
CSN: [id number]     Arrival date & time 03/22/15  1526 History   First MD Initiated Contact with Patient 03/22/15 1538     Chief Complaint  Patient presents with  . Fall  . Foot Pain     (Consider location/radiation/quality/duration/timing/severity/associated sxs/prior Treatment) Patient is a 81 y.o. female presenting with fall and lower extremity pain. The history is provided by the patient.  Fall This is a new problem. The current episode started yesterday. The problem occurs constantly. The problem has not changed since onset.Pertinent negatives include no chest pain and no abdominal pain. Nothing aggravates the symptoms. Nothing relieves the symptoms. She has tried nothing for the symptoms.  Foot Pain Pertinent negatives include no chest pain and no abdominal pain.    Past Medical History  Diagnosis Date  . CHF (congestive heart failure)   . Dementia   . Hypertension   . Coronary artery disease   . GERD (gastroesophageal reflux disease)   . Diabetes mellitus   . Atrial fibrillation    History reviewed. No pertinent past surgical history. History reviewed. No pertinent family history. History  Substance Use Topics  . Smoking status: Never Smoker   . Smokeless tobacco: Not on file  . Alcohol Use: No   OB History    No data available     Review of Systems  Unable to perform ROS: Dementia  Cardiovascular: Negative for chest pain.  Gastrointestinal: Negative for abdominal pain.      Allergies  Morphine and related  Home Medications   Prior to Admission medications   Medication Sig Start Date End Date Taking? Authorizing Provider  acetaminophen (TYLENOL) 650 MG CR tablet Take 650 mg by mouth 3 (three) times daily. arthritis    Historical Provider, MD  amLODipine (NORVASC) 5 MG tablet Take 5 mg by mouth daily.    Historical Provider, MD  aspirin EC 81 MG tablet Take 81 mg by mouth daily.    Historical Provider, MD  cephALEXin (KEFLEX) 500 MG capsule Take 1  capsule (500 mg total) by mouth 4 (four) times daily. 10/15/13   Lorre NickAnthony Allen, MD  cetirizine (ZYRTEC) 10 MG tablet Take 10 mg by mouth daily.    Historical Provider, MD  cholecalciferol (VITAMIN D) 1000 UNITS tablet Take 1,000 Units by mouth daily.    Historical Provider, MD  clotrimazole (LOTRIMIN) 1 % cream Apply to affected area 2 times daily 10/15/13   Lorre NickAnthony Allen, MD  divalproex (DEPAKOTE SPRINKLE) 125 MG capsule Take 125 mg by mouth at bedtime.    Historical Provider, MD  docusate sodium (COLACE) 100 MG capsule Take 100 mg by mouth at bedtime.    Historical Provider, MD  DULoxetine (CYMBALTA) 20 MG capsule Take 20 mg by mouth daily.    Historical Provider, MD  furosemide (LASIX) 80 MG tablet Take 160 mg by mouth 2 (two) times daily.     Historical Provider, MD  glipiZIDE (GLUCOTROL) 5 MG tablet Take 2.5 mg by mouth daily.      Historical Provider, MD  guaifenesin (ROBITUSSIN) 100 MG/5ML syrup Take 600 mg by mouth every 6 (six) hours as needed for cough.     Historical Provider, MD  hydrALAZINE (APRESOLINE) 50 MG tablet Take 50 mg by mouth 3 (three) times daily.    Historical Provider, MD  hydrocerin (EUCERIN) CREA Apply 1 application topically 2 (two) times daily.    Historical Provider, MD  HYDROcodone-acetaminophen (NORCO/VICODIN) 5-325 MG per tablet Take 0.5-1 tablets by mouth See admin instructions.  Standing Order: Take 1 tablet daily at Bedtime PRN Order: Take 0.5 tablet daily as needed for pain.    Historical Provider, MD  Lactobacillus (ACIDOPHILUS PROBIOTIC) 10 MG TABS Take 10 mg by mouth daily.    Historical Provider, MD  levothyroxine (SYNTHROID, LEVOTHROID) 25 MCG tablet Take 37.5 mcg by mouth daily.     Historical Provider, MD  loperamide (IMODIUM) 2 MG capsule Take 2-4 mg by mouth as needed for diarrhea or loose stools (Take 2 capsules by mouth after First Loose Stool then take 1 capsule by mouth As Needed for each Additional Loose Stool). For diarrhea    Historical Provider,  MD  LORazepam (ATIVAN) 0.5 MG tablet Take 0.25-0.5 mg by mouth See admin instructions. Standing order: Take 0.25mg  by mouth two times daily.  PRN order: Take 1 tablet by mouth daily as needed for agitation/anxiety    Historical Provider, MD  Multiple Vitamins-Minerals (CEROVITE PO) Take 1 tablet by mouth daily.    Historical Provider, MD  nystatin (MYCOSTATIN/NYSTOP) 100000 UNIT/GM POWD Apply topically 2 (two) times daily. To breast    Historical Provider, MD  nystatin cream (MYCOSTATIN) Apply topically 2 (two) times daily. To breasts    Historical Provider, MD  potassium chloride (K-DUR) 10 MEQ tablet Take 1 tablet (10 mEq total) by mouth 2 (two) times daily. 10/15/13   Lorre Nick, MD  promethazine (PHENERGAN) 12.5 MG tablet Take 12.5 mg by mouth every 6 (six) hours as needed. For nausea.    Historical Provider, MD  psyllium (REGULOID) 0.52 G capsule Take 0.52 g by mouth at bedtime.    Historical Provider, MD  traZODone (DESYREL) 50 MG tablet Take 50 mg by mouth at bedtime.     Historical Provider, MD   BP 144/50 mmHg  Pulse 72  Temp(Src) 98.3 F (36.8 C) (Rectal)  Resp 18  SpO2 98% Physical Exam  Constitutional: She is oriented to person, place, and time. She appears well-developed and well-nourished. No distress.  HENT:  Head: Normocephalic and atraumatic.  Mouth/Throat: Oropharynx is clear and moist.  Eyes: EOM are normal. Pupils are equal, round, and reactive to light.  Neck: Normal range of motion. Neck supple.  Cardiovascular: Normal rate and regular rhythm.  Exam reveals no friction rub.   No murmur heard. Pulmonary/Chest: Effort normal and breath sounds normal. No respiratory distress. She has no wheezes. She has no rales.  Abdominal: Soft. She exhibits no distension. There is no tenderness. There is no rebound.  Musculoskeletal: Normal range of motion. She exhibits no edema.  Neurological: She is alert and oriented to person, place, and time.  Skin: She is not diaphoretic.   Nursing note and vitals reviewed.   ED Course  Procedures (including critical care time) Labs Review Labs Reviewed - No data to display  Imaging Review Dg Chest 2 View  03/22/2015   CLINICAL DATA:  Initial encounter for fall yesterday without significant injury. Small hematoma to forehead.  EXAM: CHEST  2 VIEW  COMPARISON:  10/15/2013  FINDINGS: Lung volumes are low. Interstitial markings are diffusely coarsened with chronic features. The cardio pericardial silhouette is enlarged. Vascular congestion persists with stable appearance of a probable tiny left pleural effusion. Bones are diffusely demineralized.  IMPRESSION: No substantial interval change. Cardiomegaly with vascular congestion and probable tiny left pleural effusion.   Electronically Signed   By: Kennith Center M.D.   On: 03/22/2015 17:32   Ct Head Wo Contrast  03/22/2015   CLINICAL DATA:  Fall yesterday, dementia  EXAM: CT HEAD WITHOUT CONTRAST  TECHNIQUE: Contiguous axial images were obtained from the base of the skull through the vertex without intravenous contrast.  COMPARISON:  10/15/2013  FINDINGS: No skull fracture is noted. Paranasal sinuses and mastoid air cells are unremarkable. Atherosclerotic calcifications of carotid siphon again noted. Stable cerebral atrophy. Stable periventricular and patchy subcortical white matter decreased attenuation consistent with chronic small vessel ischemic changes. No definite acute cortical infarction. No mass lesion is noted on this unenhanced scan. Stable small old infarct in right posterior cerebellum.  IMPRESSION: No acute intracranial abnormality. Stable atrophy and chronic white matter disease. No definite acute cortical infarction.   Electronically Signed   By: Natasha Mead M.D.   On: 03/22/2015 17:38   Dg Foot Complete Right  03/22/2015   CLINICAL DATA:  RIGHT foot pain, fell yesterday, with swelling and tenderness at great toe region  EXAM: RIGHT FOOT COMPLETE - 3+ VIEW  COMPARISON:  None   FINDINGS: Osseous demineralization.  Nondisplaced fracture at base of proximal phalanx RIGHT great toe.  No definite intra-articular extension.  Hallux valgus with degenerative changes at first MTP joint.  No additional fracture, dislocation or bone destruction.  IMPRESSION: Osseous demineralization with nondisplaced fracture at base of proximal phalanx RIGHT great toe.   Electronically Signed   By: Ulyses Southward M.D.   On: 03/22/2015 17:31   Dg Hip Unilat With Pelvis 2-3 Views Right  03/22/2015   CLINICAL DATA:  Larey Seat yesterday, was holding RIGHT hip, back pain, RIGHT hip pain  EXAM: RIGHT HIP (WITH PELVIS) 2-3 VIEWS  COMPARISON:  None  FINDINGS: Osseous demineralization.  Hip and SI joint spaces preserved.  No acute fracture, dislocation or bone destruction.  Iliac artery calcifications and pelvic phleboliths noted.  IMPRESSION: No acute osseous abnormalities.   Electronically Signed   By: Ulyses Southward M.D.   On: 03/22/2015 17:32     EKG Interpretation None      MDM   Final diagnoses:  Traumatic ecchymosis of toe  Right hip pain  Fall  Fractured great toe, right, closed, initial encounter    81-year-old demented female, and after a fall. She fell yesterday. Small hematoma on the forehead, right great toe swelling. Here vitals are stable. She is demented. She cannot tell me what is going on. Large ecchymotic right great toe. She also has pain on palpation of right lateral hip. Small hematoma forehead. We'll plan on imaging.  Xray with R great toe fracture. Other imaging ok. No intracranial abnormalities. Placed on post-op shoe. Given f/u with ortho. Stable for discharge.  Elwin Mocha, MD 03/23/15 0030

## 2015-03-22 NOTE — ED Notes (Signed)
Bed: [facility] Expected date:  Expected time:  Means of arrival:  Comments: EMS-fall 

## 2015-03-22 NOTE — ED Notes (Signed)
Staff at [facility] was called and report given on pt's condition and discharge instructions.

## 2015-03-22 NOTE — Discharge Instructions (Signed)

## 2015-03-22 NOTE — ED Notes (Signed)
[facility]) with c/o right foot pain.  Staff at the facility reported that pt had a fall yesterday without significant injury---- has had a small hematoma to forehead but has had no s/s neurological deficits and has had no change in level of consciousness---- pt has dementia.  Today, pt has been complaining of pain to right foot; staff observed swelling and tenderness to right great toe area.

## 2016-02-22 ENCOUNTER — Emergency Department (HOSPITAL_COMMUNITY)
Admission: EM | Admit: 2016-02-22 | Discharge: 2016-02-23 | Disposition: A | Discharge status: Home / Self Care | Payer: Medicare Other | Attending: Emergency Medicine | Admitting: Emergency Medicine

## 2016-02-22 ENCOUNTER — Emergency Department (HOSPITAL_COMMUNITY): Payer: Medicare Other

## 2016-02-22 ENCOUNTER — Encounter (HOSPITAL_COMMUNITY): Payer: Self-pay | Admitting: Emergency Medicine

## 2016-02-22 DIAGNOSIS — I2510 Atherosclerotic heart disease of native coronary artery without angina pectoris: Secondary | ICD-10-CM | POA: Insufficient documentation

## 2016-02-22 DIAGNOSIS — I4891 Unspecified atrial fibrillation: Secondary | ICD-10-CM | POA: Diagnosis not present

## 2016-02-22 DIAGNOSIS — Y998 Other external cause status: Secondary | ICD-10-CM | POA: Diagnosis not present

## 2016-02-22 DIAGNOSIS — W1839XA Other fall on same level, initial encounter: Secondary | ICD-10-CM | POA: Diagnosis not present

## 2016-02-22 DIAGNOSIS — S3992XA Unspecified injury of lower back, initial encounter: Secondary | ICD-10-CM | POA: Insufficient documentation

## 2016-02-22 DIAGNOSIS — Z79899 Other long term (current) drug therapy: Secondary | ICD-10-CM | POA: Diagnosis not present

## 2016-02-22 DIAGNOSIS — Y92129 Unspecified place in nursing home as the place of occurrence of the external cause: Secondary | ICD-10-CM | POA: Insufficient documentation

## 2016-02-22 DIAGNOSIS — W19XXXA Unspecified fall, initial encounter: Secondary | ICD-10-CM

## 2016-02-22 DIAGNOSIS — I10 Essential (primary) hypertension: Secondary | ICD-10-CM | POA: Insufficient documentation

## 2016-02-22 DIAGNOSIS — Z7982 Long term (current) use of aspirin: Secondary | ICD-10-CM | POA: Insufficient documentation

## 2016-02-22 DIAGNOSIS — M545 Low back pain: Secondary | ICD-10-CM

## 2016-02-22 DIAGNOSIS — E119 Type 2 diabetes mellitus without complications: Secondary | ICD-10-CM | POA: Diagnosis not present

## 2016-02-22 DIAGNOSIS — I509 Heart failure, unspecified: Secondary | ICD-10-CM | POA: Diagnosis not present

## 2016-02-22 DIAGNOSIS — F0390 Unspecified dementia without behavioral disturbance: Secondary | ICD-10-CM | POA: Diagnosis not present

## 2016-02-22 DIAGNOSIS — Z8719 Personal history of other diseases of the digestive system: Secondary | ICD-10-CM | POA: Insufficient documentation

## 2016-02-22 DIAGNOSIS — Y9389 Activity, other specified: Secondary | ICD-10-CM | POA: Insufficient documentation

## 2016-02-22 MED ORDER — LORAZEPAM 2 MG/ML IJ SOLN
0.5000 mg | Freq: Once | INTRAMUSCULAR | Status: AC
  Administered 2016-02-22: 0.5 mg via INTRAVENOUS
  Filled 2016-02-22: qty 1

## 2016-02-22 NOTE — ED Notes (Signed)
[facility]. Unwitnessed fall c/o R sided back and butt pain. Unaware if pt hit head. She states she did but denies passing out. No tenderness or pain on palpation or bruising. ASA but not other blood thinners. Staff states pt acts confused but is to her normal.

## 2016-02-22 NOTE — ED Provider Notes (Addendum)
CSN: [id number]     Arrival date & time 02/22/16  2101 History   First MD Initiated Contact with Patient 02/22/16 2103     Chief Complaint  Patient presents with  . Fall     (Consider location/radiation/quality/duration/timing/severity/associated sxs/prior Treatment) HPI Comments: Patient with a history of advanced dementia, CHF, HTN, CAD, GERD, DM and atrial fibrillation presents after unwitnessed fall at her nursing home. EMS reported that the patient is complaining of back pain. Unknown whether there  Patient is a 82 y.o. female presenting with fall. The history is provided by the EMS personnel. No language interpreter was used.  Fall This is a new problem. The current episode started today.    Past Medical History  Diagnosis Date  . CHF (congestive heart failure) (HCC)   . Dementia   . Hypertension   . Coronary artery disease   . GERD (gastroesophageal reflux disease)   . Diabetes mellitus (HCC)   . Atrial fibrillation (HCC)    History reviewed. No pertinent past surgical history. History reviewed. No pertinent family history. Social History  Substance Use Topics  . Smoking status: Never Smoker   . Smokeless tobacco: None  . Alcohol Use: No   OB History    No data available     Review of Systems  Unable to perform ROS: Dementia      Allergies  Morphine and related  Home Medications   Prior to Admission medications   Medication Sig Start Date End Date Taking? Authorizing Provider  acetaminophen (TYLENOL) 650 MG CR tablet Take 650 mg by mouth 2 (two) times daily. arthritis    Historical Provider, MD  albuterol (PROVENTIL HFA;VENTOLIN HFA) 108 (90 BASE) MCG/ACT inhaler Inhale 2 puffs into the lungs every 6 (six) hours.    Historical Provider, MD  aspirin EC 81 MG tablet Take 81 mg by mouth daily.    Historical Provider, MD  carboxymethylcellulose 1 % ophthalmic solution Place 1 drop into both eyes 4 (four) times daily.    Historical Provider, MD  cephALEXin  (KEFLEX) 500 MG capsule Take 1 capsule (500 mg total) by mouth 4 (four) times daily. Patient not taking: Reported on 03/22/2015 10/15/13   Lorre NickAnthony Allen, MD  cholecalciferol (VITAMIN D) 1000 UNITS tablet Take 1,000 Units by mouth daily.    Historical Provider, MD  clotrimazole (LOTRIMIN) 1 % cream Apply to affected area 2 times daily 10/15/13   Lorre NickAnthony Allen, MD  Cranberry 450 MG TABS Take 1 tablet by mouth 2 (two) times daily.    Historical Provider, MD  divalproex (DEPAKOTE SPRINKLE) 125 MG capsule Take 125 mg by mouth 2 (two) times daily.     Historical Provider, MD  DULoxetine (CYMBALTA) 20 MG capsule Take 20 mg by mouth daily.    Historical Provider, MD  furosemide (LASIX) 40 MG tablet Take 40 mg by mouth daily.    Historical Provider, MD  guaifenesin (ROBITUSSIN) 100 MG/5ML syrup Take 600 mg by mouth every 6 (six) hours as needed for cough.     Historical Provider, MD  hydrALAZINE (APRESOLINE) 50 MG tablet Take 50 mg by mouth 2 (two) times daily.     Historical Provider, MD  hydrocerin (EUCERIN) CREA Apply 1 application topically 2 (two) times daily.    Historical Provider, MD  HYDROcodone-acetaminophen (NORCO/VICODIN) 5-325 MG per tablet Take 0.5 tablets by mouth 2 (two) times daily.     Historical Provider, MD  levothyroxine (SYNTHROID, LEVOTHROID) 25 MCG tablet Take 37.5 mcg by mouth daily.  Historical Provider, MD  lisinopril (PRINIVIL,ZESTRIL) 5 MG tablet Take 5 mg by mouth daily.    Historical Provider, MD  loperamide (IMODIUM) 2 MG capsule Take 2-4 mg by mouth as needed for diarrhea or loose stools (Take 2 capsules by mouth after First Loose Stool then take 1 capsule by mouth As Needed for each Additional Loose Stool). For diarrhea    Historical Provider, MD  LORazepam (ATIVAN) 0.5 MG tablet Take 0.25 mg by mouth 2 (two) times daily.     Historical Provider, MD  Multiple Vitamins-Minerals (CEROVITE PO) Take 1 tablet by mouth daily.    Historical Provider, MD  potassium chloride (K-DUR)  10 MEQ tablet Take 1 tablet (10 mEq total) by mouth 2 (two) times daily. Patient not taking: Reported on 03/22/2015 10/15/13   Lorre Nick, MD  potassium chloride SA (K-DUR,KLOR-CON) 20 MEQ tablet Take 20 mEq by mouth daily.    Historical Provider, MD  psyllium (REGULOID) 0.52 G capsule Take 0.52 g by mouth at bedtime.    Historical Provider, MD  sennosides-docusate sodium (SENOKOT-S) 8.6-50 MG tablet Take 1 tablet by mouth at bedtime.    Historical Provider, MD  Skin Protectants, Misc. (BAZA PROTECT EX) Apply 1 application topically 3 (three) times daily. On Buttocks.    Historical Provider, MD  traZODone (DESYREL) 50 MG tablet Take 25 mg by mouth at bedtime.     Historical Provider, MD   SpO2 95% Physical Exam  Constitutional: She appears well-developed and well-nourished. No distress.  HENT:  Head: Normocephalic and atraumatic.  Eyes: Conjunctivae are normal.  Neck: Normal range of motion. Neck supple.  Cardiovascular: Normal rate.   No murmur heard. Pulmonary/Chest: Effort normal. She has no wheezes. She has no rales.  Chest wall appears atraumatic.  Abdominal: Soft. There is no tenderness.  Abdomen appears atraumatic.  Musculoskeletal: Normal range of motion.  No midline or paracervical neck tenderness. FROM upper and lower extremities with no deformities. Mid- to lower thoracic and upper lumbar tenderness. No bruising. She expresses pain with supine position.  Neurological: She is alert.  Awake, alert. She is oriented to person but not time or place. Follows command. Speech clear, not slurred.   Skin: Skin is warm and dry.    ED Course  Procedures (including critical care time) Labs Review Labs Reviewed - No data to display Results for orders placed or performed during the hospital encounter of 02/22/16  CBC  Result Value Ref Range   WBC 9.3 4.0 - 10.5 K/uL   RBC 4.20 3.87 - 5.11 MIL/uL   Hemoglobin 13.2 12.0 - 15.0 g/dL   HCT 16.1 09.6 - 04.5 %   MCV 96.7 78.0 - 100.0 fL    MCH 31.4 26.0 - 34.0 pg   MCHC 32.5 30.0 - 36.0 g/dL   RDW 40.9 81.1 - 91.4 %   Platelets 142 (L) 150 - 400 K/uL  Basic metabolic panel  Result Value Ref Range   Sodium 138 135 - 145 mmol/L   Potassium 4.7 3.5 - 5.1 mmol/L   Chloride 106 101 - 111 mmol/L   CO2 20 (L) 22 - 32 mmol/L   Glucose, Bld 112 (H) 65 - 99 mg/dL   BUN 48 (H) 6 - 20 mg/dL   Creatinine, Ser 7.82 (H) 0.44 - 1.00 mg/dL   Calcium 9.3 8.9 - 95.6 mg/dL   GFR calc non Af Amer 30 (L) >60 mL/min   GFR calc Af Amer 35 (L) >60 mL/min   Anion gap 12  5 - 15   Dg Thoracic Spine 2 View  02/23/2016  CLINICAL DATA:  Unwitnessed fall.  Altered mental status. EXAM: THORACIC SPINE 2 VIEWS COMPARISON:  Two-view chest 03/22/2015 FINDINGS: Diffuse bone demineralization. Normal alignment of the thoracic spine. Diffuse degenerative changes with narrowed interspaces and endplate hypertrophic changes throughout. Slight anterior wedging of T11 appears chronic. No acute compression deformities are suggested. No focal bone lesion or bone destruction. No paraspinal soft tissue swelling. Vascular calcifications. IMPRESSION: Degenerative changes in the thoracic spine. No acute displaced fractures identified. Electronically Signed   By: Burman Nieves M.D.   On: 02/23/2016 01:48   Dg Lumbar Spine Complete  02/23/2016  CLINICAL DATA:  Unwitnessed fall.  Altered mental status. EXAM: LUMBAR SPINE - COMPLETE 4+ VIEW COMPARISON:  09/24/2013 FINDINGS: Diffuse bone demineralization. Normal alignment of the lumbar spine. No vertebral compression deformities. Degenerative changes with narrowed lumbar interspaces and endplate hypertrophic changes throughout. No focal bone lesion or bone destruction. Bone cortex appears intact. Vascular calcifications. IMPRESSION: Degenerative changes in the lumbar spine. No acute displaced fractures identified. Electronically Signed   By: Burman Nieves M.D.   On: 02/23/2016 01:46    Imaging Review No results found. I  have personally reviewed and evaluated these images and lab results as part of my medical decision-making.   EKG Interpretation None      MDM   Final diagnoses:  None    1. Fall 2. Dementia 3. Back pain  The patient was initially examined on arrival to Ed after reported unwitnessed fall at her nursing home. No reported LOC.  Per staff at the nursing home, no change in baseline mental status. There has been no vomiting here or change in her mental status. CT was not performed as there was no evidence of intracranial injury and no pain with neck movement. The patient's only discomfort occurred when she was required to move her back.   No trauma or bruising. Negative imaging of the thoracic and lumbar spine. She has been examined by Dr. Criss Alvine, ED attending. She is felt stable for discharge home.    Elpidio Anis, PA-C 02/23/16 0330  Pricilla Loveless, MD 02/28/16 1539  Elpidio Anis, PA-C 03/04/16 1191  Pricilla Loveless, MD 03/04/16 [phone]

## 2016-02-22 NOTE — ED Notes (Signed)
Upon assessment this RN saw a bruise and knot to L side of head and bruise to left pointer finger

## 2016-02-23 ENCOUNTER — Emergency Department (HOSPITAL_COMMUNITY): Payer: Medicare Other

## 2016-02-23 LAB — CBC
HEMATOCRIT: 40.6 % (ref 36.0–46.0)
Hemoglobin: 13.2 g/dL (ref 12.0–15.0)
MCH: 31.4 pg (ref 26.0–34.0)
MCHC: 32.5 g/dL (ref 30.0–36.0)
MCV: 96.7 fL (ref 78.0–100.0)
Platelets: 142 10*3/uL — ABNORMAL LOW (ref 150–400)
RBC: 4.2 MIL/uL (ref 3.87–5.11)
RDW: 13.4 % (ref 11.5–15.5)
WBC: 9.3 10*3/uL (ref 4.0–10.5)

## 2016-02-23 LAB — BASIC METABOLIC PANEL
Anion gap: 12 (ref 5–15)
BUN: 48 mg/dL — AB (ref 6–20)
CHLORIDE: 106 mmol/L (ref 101–111)
CO2: 20 mmol/L — AB (ref 22–32)
Calcium: 9.3 mg/dL (ref 8.9–10.3)
Creatinine, Ser: 1.46 mg/dL — ABNORMAL HIGH (ref 0.44–1.00)
GFR calc Af Amer: 35 mL/min — ABNORMAL LOW (ref >60)
GFR, EST NON AFRICAN AMERICAN: 30 mL/min — AB (ref >60)
Glucose, Bld: 112 mg/dL — ABNORMAL HIGH (ref 65–99)
Potassium: 4.7 mmol/L (ref 3.5–5.1)
Sodium: 138 mmol/L (ref 135–145)

## 2016-02-23 MED ORDER — FENTANYL CITRATE (PF) 100 MCG/2ML IJ SOLN
50.0000 ug | Freq: Once | INTRAMUSCULAR | Status: DC
  Filled 2016-02-23: qty 2

## 2016-02-23 MED ORDER — FENTANYL CITRATE (PF) 100 MCG/2ML IJ SOLN
50.0000 ug | Freq: Once | INTRAMUSCULAR | Status: AC
  Administered 2016-02-23: 50 ug via INTRAVENOUS

## 2016-02-23 NOTE — Discharge Instructions (Signed)
X-RAYS DONE AFTER MS. Plancarte FELL TONIGHT ARE NEGATIVE FOR FRACTURE. SHE IS RESTING MORE COMFORTABLY WITH PAIN MEDICATION. NO CHANGE IN HER MENTAL STATUS THROUGH THE DURATION OF HER EMERGENCY DEPARTMENT STAY AND NO REPORT OF CHANGE FROM HER BASELINE FROM NURSING HOME STAFF. HAVE HER RE-EXAMINED BY DR. Redmond SchoolRIPP TO DETERMINE ANY NEED FOR PAIN MANAGEMENT.

## 2016-02-23 NOTE — ED Notes (Signed)
[facility] here to transport patient back to SNF

## 2016-02-23 NOTE — ED Notes (Signed)
[facility] CALLED @ 0343. 

## 2016-03-15 ENCOUNTER — Emergency Department (HOSPITAL_COMMUNITY): Payer: Medicare Other

## 2016-03-15 ENCOUNTER — Encounter (HOSPITAL_COMMUNITY): Payer: Self-pay | Admitting: Emergency Medicine

## 2016-03-15 ENCOUNTER — Emergency Department (HOSPITAL_COMMUNITY)
Admission: EM | Admit: 2016-03-15 | Discharge: 2016-03-18 | Disposition: A | Discharge status: Home / Self Care | Payer: Medicare Other | Attending: Emergency Medicine | Admitting: Emergency Medicine

## 2016-03-15 DIAGNOSIS — S4991XA Unspecified injury of right shoulder and upper arm, initial encounter: Secondary | ICD-10-CM | POA: Diagnosis not present

## 2016-03-15 DIAGNOSIS — K219 Gastro-esophageal reflux disease without esophagitis: Secondary | ICD-10-CM | POA: Insufficient documentation

## 2016-03-15 DIAGNOSIS — I10 Essential (primary) hypertension: Secondary | ICD-10-CM | POA: Diagnosis not present

## 2016-03-15 DIAGNOSIS — W1839XA Other fall on same level, initial encounter: Secondary | ICD-10-CM | POA: Insufficient documentation

## 2016-03-15 DIAGNOSIS — I2510 Atherosclerotic heart disease of native coronary artery without angina pectoris: Secondary | ICD-10-CM | POA: Insufficient documentation

## 2016-03-15 DIAGNOSIS — S61412A Laceration without foreign body of left hand, initial encounter: Secondary | ICD-10-CM | POA: Insufficient documentation

## 2016-03-15 DIAGNOSIS — Y998 Other external cause status: Secondary | ICD-10-CM | POA: Diagnosis not present

## 2016-03-15 DIAGNOSIS — S8002XA Contusion of left knee, initial encounter: Secondary | ICD-10-CM | POA: Diagnosis not present

## 2016-03-15 DIAGNOSIS — E119 Type 2 diabetes mellitus without complications: Secondary | ICD-10-CM | POA: Diagnosis not present

## 2016-03-15 DIAGNOSIS — Z7982 Long term (current) use of aspirin: Secondary | ICD-10-CM | POA: Diagnosis not present

## 2016-03-15 DIAGNOSIS — S8992XA Unspecified injury of left lower leg, initial encounter: Secondary | ICD-10-CM | POA: Diagnosis present

## 2016-03-15 DIAGNOSIS — W19XXXA Unspecified fall, initial encounter: Secondary | ICD-10-CM

## 2016-03-15 DIAGNOSIS — F0390 Unspecified dementia without behavioral disturbance: Secondary | ICD-10-CM | POA: Insufficient documentation

## 2016-03-15 DIAGNOSIS — S4992XA Unspecified injury of left shoulder and upper arm, initial encounter: Secondary | ICD-10-CM | POA: Insufficient documentation

## 2016-03-15 DIAGNOSIS — I509 Heart failure, unspecified: Secondary | ICD-10-CM | POA: Diagnosis not present

## 2016-03-15 DIAGNOSIS — Z79899 Other long term (current) drug therapy: Secondary | ICD-10-CM | POA: Diagnosis not present

## 2016-03-15 DIAGNOSIS — Y9289 Other specified places as the place of occurrence of the external cause: Secondary | ICD-10-CM | POA: Insufficient documentation

## 2016-03-15 DIAGNOSIS — Y9389 Activity, other specified: Secondary | ICD-10-CM | POA: Diagnosis not present

## 2016-03-15 DIAGNOSIS — R52 Pain, unspecified: Secondary | ICD-10-CM

## 2016-03-15 DIAGNOSIS — S40029A Contusion of unspecified upper arm, initial encounter: Secondary | ICD-10-CM

## 2016-03-15 IMAGING — CT CT HEAD W/O CM
3 of 4 series · 16 of 30 positions shown, 17 images · non-contrast
Comparison: CT of the head performed [DATE]

CLINICAL DATA: Status post unwitnessed fall. Concern for head
injury. Initial encounter.

EXAM:
CT HEAD WITHOUT CONTRAST
TECHNIQUE: Contiguous axial images were obtained from the base of the skull
through the vertex without intravenous contrast.

[Series 2: head without · axial · non-contrast · 0.44mm/px · z∈[-205,-115]mm · 4 of 30 slices shown, 5 images]
[im 6/30  brain]
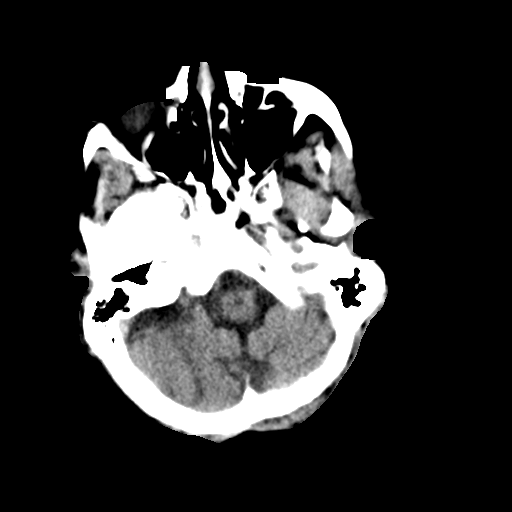
[im 6/30  bone]
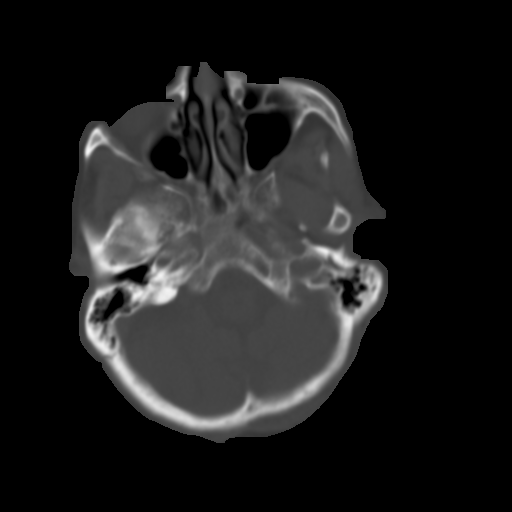
[im 12/30  brain]
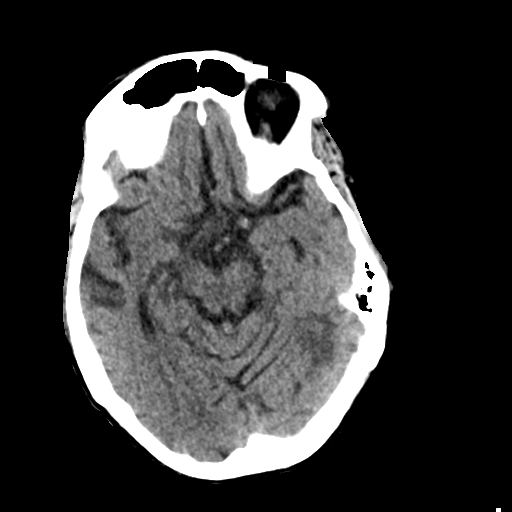
[im 18/30  brain]
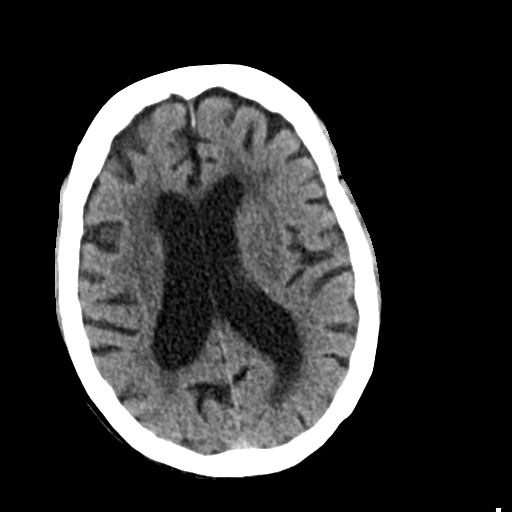
[im 24/30  brain]
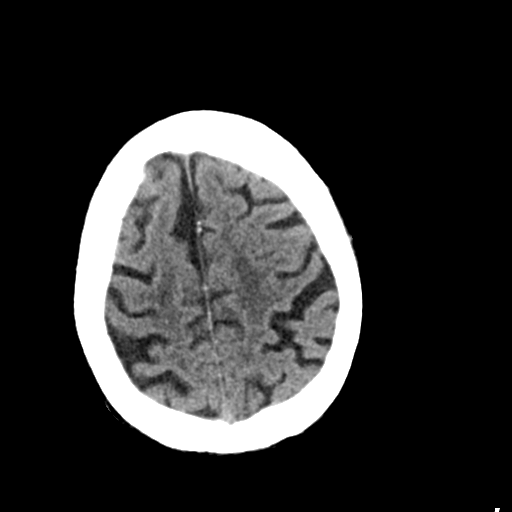

[Series 3: head bone · axial · 0.44mm/px · z∈[-222,-94]mm · 8 of 74 slices shown (1 of 2)]
[im 5/74  bone]
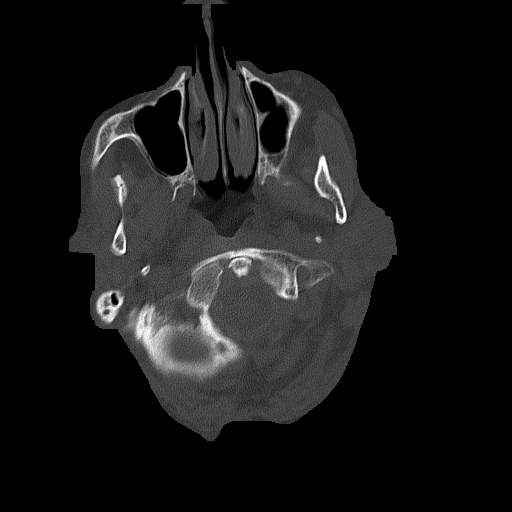
[im 15/74  bone]
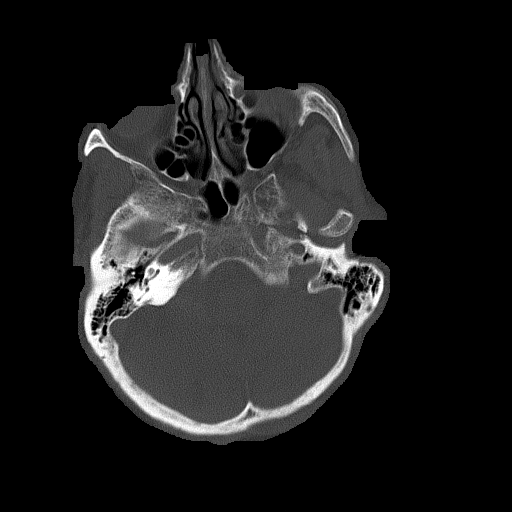
[im 25/74  bone]
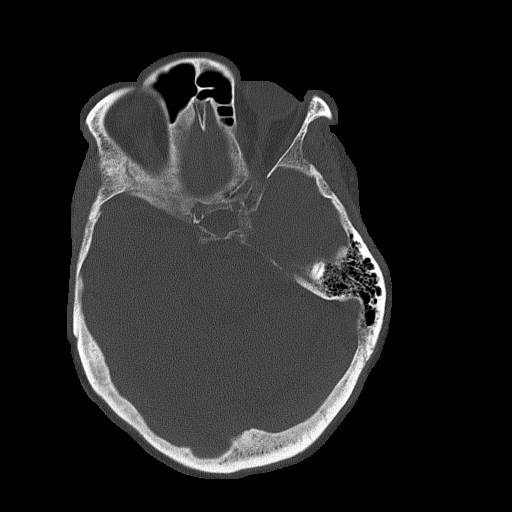
[im 35/74  bone]
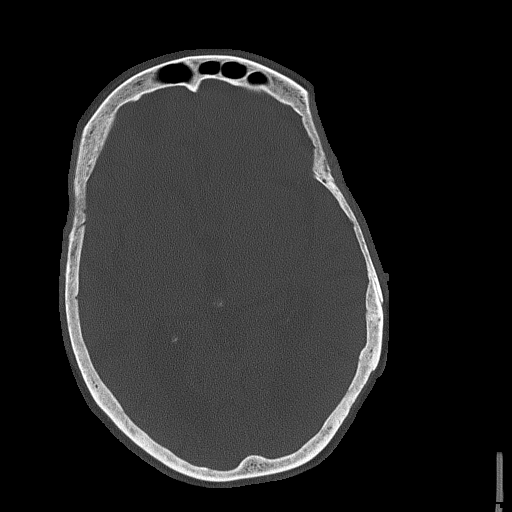
[im 39/74  bone]
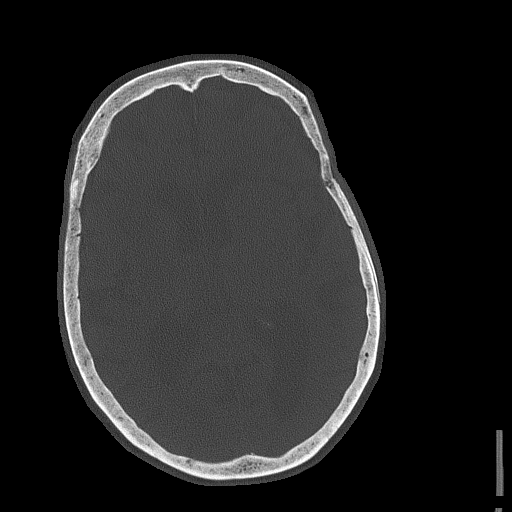
[im 49/74  bone]
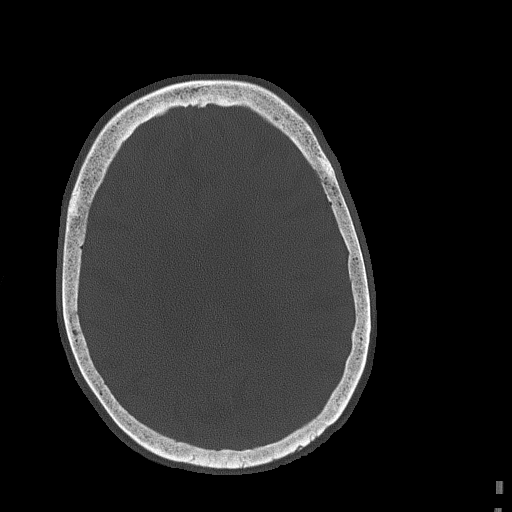
[im 59/74  bone]
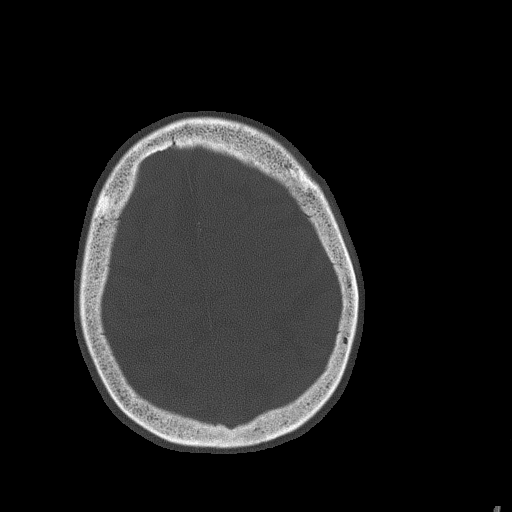
[im 69/74  bone]
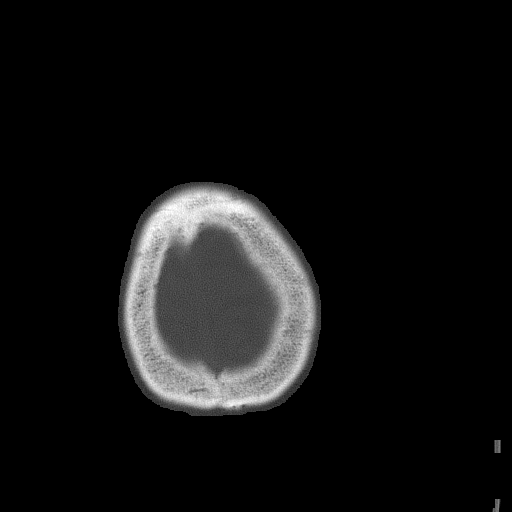

[Series 5: head bone · axial · 0.44mm/px · z∈[-104,-72]mm · 4 of 28 slices shown (2 of 2)]
[im 6/28  bone]
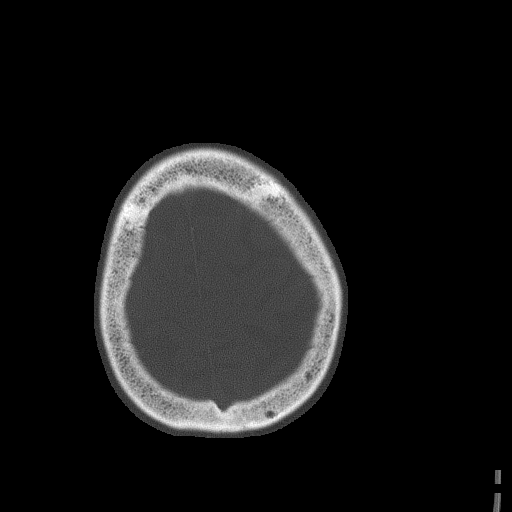
[im 11/28  bone]
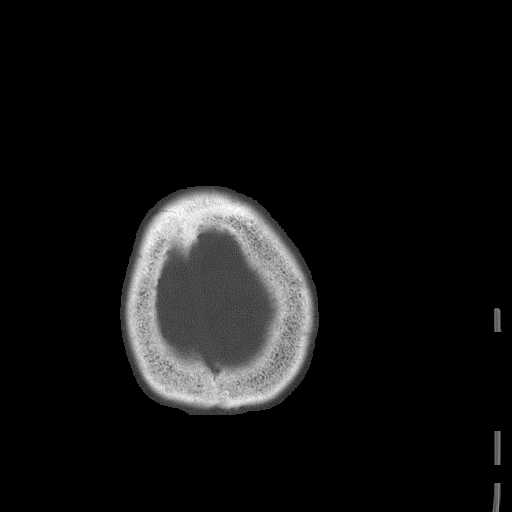
[im 17/28  bone]
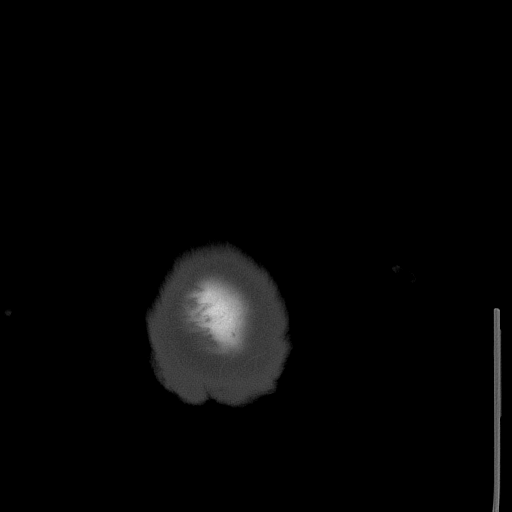
[im 22/28  bone]
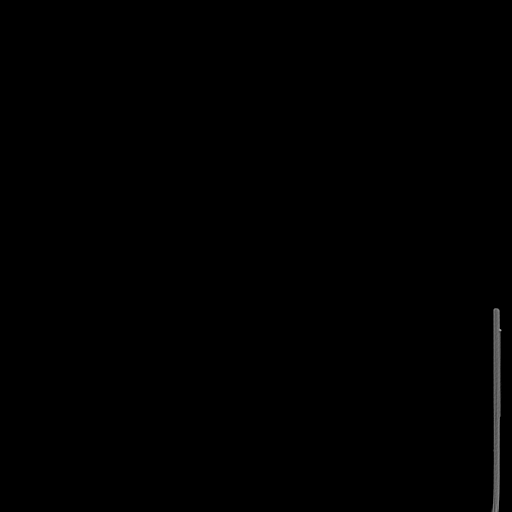

[16 of 30 positions shown; findings below may reference images not displayed]

FINDINGS: There is no evidence of acute infarction, mass lesion, or intra- or
extra-axial hemorrhage on CT.

Prominence of the ventricles and sulci reflects moderate cortical
volume loss. There is mild ex vacuo dilatation of the right lateral
ventricle. Cerebellar atrophy is noted. A chronic lacunar infarct is
noted at the right cerebellar hemisphere. Scattered periventricular
and subcortical white matter change likely reflects small vessel
ischemic microangiopathy.

The brainstem and fourth ventricle are within normal limits. The
basal ganglia are unremarkable in appearance. The cerebral
hemispheres demonstrate grossly normal gray-white differentiation.
No mass effect or midline shift is seen.

There is no evidence of fracture; visualized osseous structures are
unremarkable in appearance. The orbits are within normal limits. The
paranasal sinuses and mastoid air cells are well-aerated. No
significant soft tissue abnormalities are seen.
IMPRESSION: 1. No evidence of traumatic intracranial injury or fracture.
2. Moderate cortical volume loss and scattered small vessel ischemic
microangiopathy.
3. Chronic lacunar infarct at the right cerebellar hemisphere.

## 2016-03-15 NOTE — ED Provider Notes (Addendum)
CSN: [id number]     Arrival date & time 03/15/16  2023 History   First MD Initiated Contact with Patient 03/15/16 2024     Chief Complaint  Patient presents with  . Fall  . Arm Pain     (Consider location/radiation/quality/duration/timing/severity/associated sxs/prior Treatment) Patient is a 82 y.o. female presenting with fall and arm pain. The history is provided by the nursing home and the EMS personnel. The history is limited by the condition of the patient.  Fall This is a recurrent (Patient lives in a dementia unit and was found today after an unwitnessed fall face down over her walker. She has been awake and alert and at her baseline mental status since found on the floor. No noted LOC) problem. The current episode started 1 to 2 hours ago. The problem occurs constantly. The problem has been resolved. Associated symptoms comments: Bilateral arm pain.  Arm Pain    Past Medical History  Diagnosis Date  . CHF (congestive heart failure) (HCC)   . Dementia   . Hypertension   . Coronary artery disease   . GERD (gastroesophageal reflux disease)   . Diabetes mellitus (HCC)   . Atrial fibrillation (HCC)    History reviewed. No pertinent past surgical history. History reviewed. No pertinent family history. Social History  Substance Use Topics  . Smoking status: Never Smoker   . Smokeless tobacco: None  . Alcohol Use: No   OB History    No data available     Review of Systems  Unable to perform ROS: Dementia      Allergies  Morphine and related  Home Medications   Prior to Admission medications   Medication Sig Start Date End Date Taking? Authorizing Provider  levothyroxine (SYNTHROID, LEVOTHROID) 25 MCG tablet Take 37.5 mcg by mouth daily.    Yes Historical Provider, MD  acetaminophen (TYLENOL) 650 MG CR tablet Take 650 mg by mouth 2 (two) times daily. arthritis    Historical Provider, MD  albuterol (PROVENTIL HFA;VENTOLIN HFA) 108 (90 BASE) MCG/ACT inhaler Inhale 2  puffs into the lungs every 6 (six) hours.    Historical Provider, MD  aspirin EC 81 MG tablet Take 81 mg by mouth daily.    Historical Provider, MD  carboxymethylcellulose 1 % ophthalmic solution Place 1 drop into both eyes 4 (four) times daily.    Historical Provider, MD  carvedilol (COREG) 3.125 MG tablet Take 3.125 mg by mouth 2 (two) times daily with a meal.    Historical Provider, MD  cephALEXin (KEFLEX) 500 MG capsule Take 1 capsule (500 mg total) by mouth 4 (four) times daily. Patient not taking: Reported on 03/22/2015 10/15/13   Lorre Nick, MD  cholecalciferol (VITAMIN D) 1000 UNITS tablet Take 1,000 Units by mouth daily.    Historical Provider, MD  clotrimazole (LOTRIMIN) 1 % cream Apply to affected area 2 times daily Patient not taking: Reported on 02/22/2016 10/15/13   Lorre Nick, MD  Cranberry 450 MG TABS Take 1 tablet by mouth 2 (two) times daily.    Historical Provider, MD  divalproex (DEPAKOTE SPRINKLE) 125 MG capsule Take 125 mg by mouth 2 (two) times daily.     Historical Provider, MD  DULoxetine (CYMBALTA) 20 MG capsule Take 20 mg by mouth daily.    Historical Provider, MD  furosemide (LASIX) 40 MG tablet Take 40 mg by mouth daily.    Historical Provider, MD  guaifenesin (ROBITUSSIN) 100 MG/5ML syrup Take 600 mg by mouth every 6 (six) hours as  needed for cough.     Historical Provider, MD  hydrALAZINE (APRESOLINE) 50 MG tablet Take 50 mg by mouth 2 (two) times daily.     Historical Provider, MD  hydrocerin (EUCERIN) CREA Apply 1 application topically 2 (two) times daily.    Historical Provider, MD  HYDROcodone-acetaminophen (NORCO/VICODIN) 5-325 MG per tablet Take 0.5 tablets by mouth 2 (two) times daily.     Historical Provider, MD  lisinopril (PRINIVIL,ZESTRIL) 2.5 MG tablet Take 2.5 mg by mouth daily. Take along with the  tablet per [facility]    Historical Provider, MD  lisinopril (PRINIVIL,ZESTRIL) 5 MG tablet Take 5 mg by mouth daily. Reported on 02/22/2016    Historical  Provider, MD  loperamide (IMODIUM) 2 MG capsule Take 2-4 mg by mouth as needed for diarrhea or loose stools (Take 2 capsules by mouth after First Loose Stool then take 1 capsule by mouth As Needed for each Additional Loose Stool). For diarrhea    Historical Provider, MD  LORazepam (ATIVAN) 0.5 MG tablet Take 0.25 mg by mouth at bedtime.     Historical Provider, MD  Multiple Vitamins-Minerals (CEROVITE PO) Take 1 tablet by mouth daily.    Historical Provider, MD  nystatin cream (MYCOSTATIN) Apply 1 application topically 3 (three) times daily. Apply under the breasts    Historical Provider, MD  potassium chloride (K-DUR) 10 MEQ tablet Take 1 tablet (10 mEq total) by mouth 2 (two) times daily. Patient not taking: Reported on 03/22/2015 10/15/13   Lorre Nick, MD  potassium chloride SA (K-DUR,KLOR-CON) 20 MEQ tablet Take 20 mEq by mouth daily.    Historical Provider, MD  psyllium (REGULOID) 0.52 G capsule Take 0.52 g by mouth at bedtime.    Historical Provider, MD  sennosides-docusate sodium (SENOKOT-S) 8.6-50 MG tablet Take 1 tablet by mouth at bedtime.    Historical Provider, MD  Skin Protectants, Misc. (BAZA PROTECT EX) Apply 1 application topically 3 (three) times daily. On Buttocks.    Historical Provider, MD  traZODone (DESYREL) 50 MG tablet Take 25 mg by mouth at bedtime.     Historical Provider, MD   SpO2 95% Physical Exam  Constitutional: She appears well-developed and well-nourished. No distress.  HENT:  Head: Normocephalic and atraumatic.  Mouth/Throat: Oropharynx is clear and moist.  Eyes: Conjunctivae and EOM are normal. Pupils are equal, round, and reactive to light.  Neck: Normal range of motion. Neck supple.  Cardiovascular: Normal rate, regular rhythm and intact distal pulses.   No murmur heard. Pulmonary/Chest: Effort normal and breath sounds normal. No respiratory distress. She has no wheezes. She has no rales.  Abdominal: Soft. She exhibits no distension. There is no  tenderness. There is no rebound and no guarding.  Musculoskeletal: Normal range of motion. She exhibits tenderness. She exhibits no edema.       Right shoulder: She exhibits tenderness, bony tenderness and pain. She exhibits no deformity.       Left shoulder: She exhibits tenderness and bony tenderness. She exhibits no deformity, normal pulse and normal strength.       Right elbow: Normal.      Left elbow: Normal.       Right wrist: Normal.       Left wrist: Normal.       Right hip: Normal.       Left hip: Normal.       Right knee: Normal.       Left knee: She exhibits ecchymosis. She exhibits normal range of motion and  no swelling. No tenderness found.       Right ankle: Normal.       Left ankle: Normal.       Cervical back: Normal.       Legs: Neurological: She is alert.  Oriented to person, place and time  Skin: Skin is warm and dry. No rash noted. No erythema.  Skin tear over the left hand that is superficial.  Ecchymosis in various stages of healing over the bilateral upper extremities  Psychiatric: She has a normal mood and affect. Her behavior is normal.  Nursing note and vitals reviewed.   ED Course  Procedures (including critical care time) Labs Review Labs Reviewed - No data to display  Imaging Review Dg Shoulder Right  03/15/2016  CLINICAL DATA:  Status post unwitnessed fall, with bilateral shoulder pain, worse on the right. Initial encounter. EXAM: RIGHT SHOULDER - 2+ VIEW COMPARISON:  None. FINDINGS: There is no evidence of fracture or dislocation. The right humeral head is seated within the glenoid fossa. Mild degenerative change is noted at the right acromioclavicular joint. No significant soft tissue abnormalities are seen. The visualized portions of the right lung are clear. IMPRESSION: No evidence of fracture or dislocation. Electronically Signed   By: Roanna RaiderJeffery  Chang M.D.   On: 03/15/2016 22:00   Ct Head Wo Contrast  03/15/2016  CLINICAL DATA:  Status post  unwitnessed fall. Concern for head injury. Initial encounter. EXAM: CT HEAD WITHOUT CONTRAST TECHNIQUE: Contiguous axial images were obtained from the base of the skull through the vertex without intravenous contrast. COMPARISON:  CT of the head performed 03/22/2015 FINDINGS: There is no evidence of acute infarction, mass lesion, or intra- or extra-axial hemorrhage on CT. Prominence of the ventricles and sulci reflects moderate cortical volume loss. There is mild ex vacuo dilatation of the right lateral ventricle. Cerebellar atrophy is noted. A chronic lacunar infarct is noted at the right cerebellar hemisphere. Scattered periventricular and subcortical white matter change likely reflects small vessel ischemic microangiopathy. The brainstem and fourth ventricle are within normal limits. The basal ganglia are unremarkable in appearance. The cerebral hemispheres demonstrate grossly normal gray-white differentiation. No mass effect or midline shift is seen. There is no evidence of fracture; visualized osseous structures are unremarkable in appearance. The orbits are within normal limits. The paranasal sinuses and mastoid air cells are well-aerated. No significant soft tissue abnormalities are seen. IMPRESSION: 1. No evidence of traumatic intracranial injury or fracture. 2. Moderate cortical volume loss and scattered small vessel ischemic microangiopathy. 3. Chronic lacunar infarct at the right cerebellar hemisphere. Electronically Signed   By: Roanna RaiderJeffery  Chang M.D.   On: 03/15/2016 23:06   Dg Shoulder Left  03/15/2016  CLINICAL DATA:  Status post unwitnessed fall, with bilateral shoulder pain. Initial encounter. EXAM: LEFT SHOULDER - 2+ VIEW COMPARISON:  None. FINDINGS: There is no evidence of fracture or dislocation. The left humeral head is seated within the glenoid fossa. The acromioclavicular joint is unremarkable in appearance. No significant soft tissue abnormalities are seen. The visualized portions of the left  lung are clear. IMPRESSION: No evidence of fracture or dislocation. Electronically Signed   By: Roanna RaiderJeffery  Chang M.D.   On: 03/15/2016 21:59   I have personally reviewed and evaluated these images and lab results as part of my medical decision-making.   EKG Interpretation   Date/Time:  Friday March 15 2016 20:40:13 EDT Ventricular Rate:  67 PR Interval:    QRS Duration: 129 QT Interval:  387 QTC Calculation:  408 R Axis:   71 Text Interpretation:  Atrial fibrillation Right bundle branch block  Anteroseptal infarct, age indeterminate No significant change since last  tracing Confirmed by [facility]  MD, Alphonzo Lemmings ([id number]) on 03/15/2016 8:48:11 PM      MDM   Final diagnoses:  Fall, initial encounter  Contusion, upper extremity, unspecified laterality, initial encounter    Patient is coming from nursing facility after an unwitnessed fall where she was laying face down on her walker. Patient is at baseline per EMS and facility. She was noted to have a skin tear on her left hand and right arm pain. Initial EMS noted a deformity of her left hip denying any hip pain. Patient is awake and alert at this time but does have dementia and cannot give a history.  She has a mild abrasion to the left knee but full range of motion of bilateral ankles, knees and hips. No evidence of injury or tenderness to the spine and no facial or scalp trauma. Patient was on anticoagulation which was DC'd approximately 1 week ago. She is having pain with palpation to the right and left proximal humerus. No elbow or wrist pain bilaterally.  Head CT, bilateral shoulder imaging pending  11:12 PM Imaging neg and pt d/ced home.  Gwyneth Sprout, MD 03/15/16 1308  Gwyneth Sprout, MD 03/15/16 [phone]

## 2016-03-15 NOTE — ED Notes (Signed)
Unable to get pt blood pressure at this time due to pt complaining of pain when blood pressure is taken

## 2016-03-15 NOTE — ED Notes (Signed)
[facility] with unwitnessed [facility]. EMS reports pt was laying face down on her walker when they arrived. Pt c/o right arm pain and has a skin tear to left hand. EMS noted a deformity to left hip. Pt oriented to her abseline per staff at facility. Pt alert at this time.

## 2016-03-15 NOTE — Discharge Instructions (Signed)

## 2016-06-23 ENCOUNTER — Encounter (HOSPITAL_COMMUNITY): Payer: Self-pay | Admitting: Emergency Medicine

## 2016-06-23 ENCOUNTER — Emergency Department (HOSPITAL_COMMUNITY): Payer: Medicare Other

## 2016-06-23 ENCOUNTER — Emergency Department (HOSPITAL_COMMUNITY)
Admission: EM | Admit: 2016-06-23 | Discharge: 2016-06-23 | Disposition: A | Discharge status: Home / Self Care | Payer: Medicare Other | Attending: Emergency Medicine | Admitting: Emergency Medicine

## 2016-06-23 DIAGNOSIS — I110 Hypertensive heart disease with heart failure: Secondary | ICD-10-CM | POA: Diagnosis not present

## 2016-06-23 DIAGNOSIS — Z79899 Other long term (current) drug therapy: Secondary | ICD-10-CM | POA: Diagnosis not present

## 2016-06-23 DIAGNOSIS — I2510 Atherosclerotic heart disease of native coronary artery without angina pectoris: Secondary | ICD-10-CM | POA: Insufficient documentation

## 2016-06-23 DIAGNOSIS — E119 Type 2 diabetes mellitus without complications: Secondary | ICD-10-CM | POA: Insufficient documentation

## 2016-06-23 DIAGNOSIS — I4891 Unspecified atrial fibrillation: Secondary | ICD-10-CM | POA: Insufficient documentation

## 2016-06-23 DIAGNOSIS — N390 Urinary tract infection, site not specified: Secondary | ICD-10-CM | POA: Diagnosis not present

## 2016-06-23 DIAGNOSIS — I509 Heart failure, unspecified: Secondary | ICD-10-CM | POA: Diagnosis not present

## 2016-06-23 DIAGNOSIS — Z792 Long term (current) use of antibiotics: Secondary | ICD-10-CM | POA: Diagnosis not present

## 2016-06-23 DIAGNOSIS — R0602 Shortness of breath: Secondary | ICD-10-CM | POA: Diagnosis present

## 2016-06-23 LAB — URINE MICROSCOPIC-ADD ON

## 2016-06-23 LAB — CBC WITH DIFFERENTIAL/PLATELET
BASOS ABS: 0 10*3/uL (ref 0.0–0.1)
Basophils Relative: 0 %
Eosinophils Absolute: 0.2 10*3/uL (ref 0.0–0.7)
Eosinophils Relative: 3 %
HEMATOCRIT: 37.9 % (ref 36.0–46.0)
HEMOGLOBIN: 12.2 g/dL (ref 12.0–15.0)
LYMPHS ABS: 1.2 10*3/uL (ref 0.7–4.0)
LYMPHS PCT: 18 %
MCH: 30.8 pg (ref 26.0–34.0)
MCHC: 32.2 g/dL (ref 30.0–36.0)
MCV: 95.7 fL (ref 78.0–100.0)
Monocytes Absolute: 0.7 10*3/uL (ref 0.1–1.0)
Monocytes Relative: 10 %
NEUTROS ABS: 4.4 10*3/uL (ref 1.7–7.7)
NEUTROS PCT: 69 %
Platelets: 234 10*3/uL (ref 150–400)
RBC: 3.96 MIL/uL (ref 3.87–5.11)
RDW: 13.7 % (ref 11.5–15.5)
WBC: 6.4 10*3/uL (ref 4.0–10.5)

## 2016-06-23 LAB — URINALYSIS, ROUTINE W REFLEX MICROSCOPIC
Bilirubin Urine: NEGATIVE
GLUCOSE, UA: NEGATIVE mg/dL
Ketones, ur: NEGATIVE mg/dL
NITRITE: NEGATIVE
PH: 6 (ref 5.0–8.0)
PROTEIN: 100 mg/dL — AB
SPECIFIC GRAVITY, URINE: 1.014 (ref 1.005–1.030)

## 2016-06-23 LAB — COMPREHENSIVE METABOLIC PANEL
ALK PHOS: 42 U/L (ref 38–126)
ALT: 12 U/L — AB (ref 14–54)
AST: 33 U/L (ref 15–41)
Albumin: 3.2 g/dL — ABNORMAL LOW (ref 3.5–5.0)
Anion gap: 6 (ref 5–15)
BUN: 17 mg/dL (ref 6–20)
CALCIUM: 8.4 mg/dL — AB (ref 8.9–10.3)
CO2: 23 mmol/L (ref 22–32)
CREATININE: 1.01 mg/dL — AB (ref 0.44–1.00)
Chloride: 112 mmol/L — ABNORMAL HIGH (ref 101–111)
GFR, EST AFRICAN AMERICAN: 54 mL/min — AB (ref >60)
GFR, EST NON AFRICAN AMERICAN: 47 mL/min — AB (ref >60)
Glucose, Bld: 100 mg/dL — ABNORMAL HIGH (ref 65–99)
Potassium: 4.4 mmol/L (ref 3.5–5.1)
Sodium: 141 mmol/L (ref 135–145)
Total Bilirubin: 1.5 mg/dL — ABNORMAL HIGH (ref 0.3–1.2)
Total Protein: 6.3 g/dL — ABNORMAL LOW (ref 6.5–8.1)

## 2016-06-23 MED ORDER — SODIUM CHLORIDE 0.9 % IV BOLUS (SEPSIS)
250.0000 mL | Freq: Once | INTRAVENOUS | Status: AC
  Administered 2016-06-23: 250 mL via INTRAVENOUS

## 2016-06-23 MED ORDER — CEFTRIAXONE SODIUM 1 G IJ SOLR
1.0000 g | Freq: Once | INTRAMUSCULAR | Status: AC
  Administered 2016-06-23: 1 g via INTRAVENOUS
  Filled 2016-06-23: qty 10

## 2016-06-23 MED ORDER — CEPHALEXIN 500 MG PO CAPS: 500.0000 mg | ORAL_CAPSULE | Freq: Four times a day (QID) | ORAL | Status: AC

## 2016-06-23 MED ORDER — SODIUM CHLORIDE 0.9 % IV SOLN
INTRAVENOUS | Status: DC
  Administered 2016-06-23: 17:00:00 via INTRAVENOUS

## 2016-06-23 NOTE — ED Notes (Signed)
I have just notified [facility] for transport [facility].  She is receiving her Rocephin infusion as I write this.  She remains in no distress.

## 2016-06-23 NOTE — ED Notes (Signed)
She is awake and minimally verbal and is in no distress.

## 2016-06-23 NOTE — Discharge Instructions (Signed)
Workup here today shows evidence of urinary tract infection. Urine culture is been sent. No evidence of any respiratory problems. Oxygen saturation saturations have been normal here. Would recommend continuing Keflex for the next 7 days for the urinary tract infection. Prescription provided.

## 2016-06-23 NOTE — ED Notes (Signed)
Bed: [id number] Expected date: 06/23/16 Expected time: 12:43 PM [facility]

## 2016-06-23 NOTE — ED Provider Notes (Signed)
CSN: [id number]     Arrival date & time 06/23/16  1248 History   First MD Initiated Contact with Patient 06/23/16 1329     Chief Complaint  Patient presents with  . Shortness of Breath     (Consider location/radiation/quality/duration/timing/severity/associated sxs/prior Treatment) Patient is a 82 y.o. female presenting with shortness of breath. The history is provided by the patient and the nursing home. The history is limited by the condition of the patient.  Shortness of Breath Patient with known history of dementia. Sent in from [facility] house at [facility] for the complaint of shortness of breath. Patient was complaining of shortness of breath staff did not necessarily notice any particular problems. EMS noted that lung sounds were clear bilaterally oxygen saturation serum room air were 94%. Patient's mental status as per nursing home is at baseline. Patient smelled as if there was a possible urinary tract infection. So workup pursued.  Past Medical History  Diagnosis Date  . CHF (congestive heart failure) (HCC)   . Dementia   . Hypertension   . Coronary artery disease   . GERD (gastroesophageal reflux disease)   . Diabetes mellitus (HCC)   . Atrial fibrillation (HCC)    History reviewed. No pertinent past surgical history. No family history on file. Social History  Substance Use Topics  . Smoking status: Never Smoker   . Smokeless tobacco: None  . Alcohol Use: No   OB History    No data available     Review of Systems  Unable to perform ROS: Dementia  Respiratory: Positive for shortness of breath.       Allergies  Morphine and related  Home Medications   Prior to Admission medications   Medication Sig Start Date End Date Taking? Authorizing Provider  acetaminophen (TYLENOL) 650 MG CR tablet Take 650 mg by mouth 2 (two) times daily. arthritis    Historical Provider, MD  albuterol (PROVENTIL HFA;VENTOLIN HFA) 108 (90 BASE) MCG/ACT inhaler Inhale 2 puffs into the  lungs every 6 (six) hours.    Historical Provider, MD  carboxymethylcellulose 1 % ophthalmic solution Place 1 drop into both eyes 4 (four) times daily.    Historical Provider, MD  carvedilol (COREG) 3.125 MG tablet Take 3.125 mg by mouth 2 (two) times daily with a meal.    Historical Provider, MD  cephALEXin (KEFLEX) 500 MG capsule Take 1 capsule (500 mg total) by mouth 4 (four) times daily. Patient not taking: Reported on 03/22/2015 10/15/13   Lorre Nick, MD  cephALEXin (KEFLEX) 500 MG capsule Take 1 capsule (500 mg total) by mouth 4 (four) times daily. 06/23/16   Vanetta Mulders, MD  cholecalciferol (VITAMIN D) 1000 UNITS tablet Take 1,000 Units by mouth daily.    Historical Provider, MD  clotrimazole (LOTRIMIN) 1 % cream Apply to affected area 2 times daily Patient not taking: Reported on 02/22/2016 10/15/13   Lorre Nick, MD  Cranberry 450 MG TABS Take 1 tablet by mouth 2 (two) times daily.    Historical Provider, MD  divalproex (DEPAKOTE SPRINKLE) 125 MG capsule Take 125 mg by mouth 2 (two) times daily.     Historical Provider, MD  DULoxetine (CYMBALTA) 30 MG capsule Take 30 mg by mouth daily.    Historical Provider, MD  furosemide (LASIX) 40 MG tablet Take 40 mg by mouth daily.    Historical Provider, MD  guaifenesin (ROBITUSSIN) 100 MG/5ML syrup Take 600 mg by mouth every 6 (six) hours as needed for cough.  Historical Provider, MD  hydrALAZINE (APRESOLINE) 50 MG tablet Take 50 mg by mouth 2 (two) times daily.     Historical Provider, MD  hydrocerin (EUCERIN) CREA Apply 1 application topically 2 (two) times daily.    Historical Provider, MD  HYDROcodone-acetaminophen (NORCO/VICODIN) 5-325 MG per tablet Take 0.5 tablets by mouth 3 (three) times daily.     Historical Provider, MD  levothyroxine (SYNTHROID, LEVOTHROID) 25 MCG tablet Take 37.5 mcg by mouth daily.     Historical Provider, MD  lisinopril (PRINIVIL,ZESTRIL) 2.5 MG tablet Take 2.5 mg by mouth daily. Take along with the 5mg  tablet  per [facility]    Historical Provider, MD  lisinopril (PRINIVIL,ZESTRIL) 5 MG tablet Take 5 mg by mouth daily. Take along with the 2.5mg     Historical Provider, MD  loperamide (IMODIUM) 2 MG capsule Take 2-4 mg by mouth as needed for diarrhea or loose stools (Take 2 capsules by mouth after First Loose Stool then take 1 capsule by mouth As Needed for each Additional Loose Stool). For diarrhea    Historical Provider, MD  LORazepam (ATIVAN) 0.5 MG tablet Take 0.25 mg by mouth at bedtime.     Historical Provider, MD  Multiple Vitamins-Minerals (CEROVITE PO) Take 1 tablet by mouth daily.    Historical Provider, MD  nystatin cream (MYCOSTATIN) Apply 1 application topically 3 (three) times daily. Apply under the breasts    Historical Provider, MD  potassium chloride (K-DUR) 10 MEQ tablet Take 1 tablet (10 mEq total) by mouth 2 (two) times daily. Patient not taking: Reported on 03/22/2015 10/15/13   Lorre NickAnthony Allen, MD  potassium chloride SA (K-DUR,KLOR-CON) 20 MEQ tablet Take 20 mEq by mouth daily.    Historical Provider, MD  QUEtiapine (SEROQUEL) 25 MG tablet Take 25 mg by mouth at bedtime.    Historical Provider, MD  sennosides-docusate sodium (SENOKOT-S) 8.6-50 MG tablet Take 1 tablet by mouth at bedtime.    Historical Provider, MD  Skin Protectants, Misc. (BAZA PROTECT EX) Apply 1 application topically 3 (three) times daily. On Buttocks.    Historical Provider, MD  traZODone (DESYREL) 50 MG tablet Take 25 mg by mouth at bedtime.     Historical Provider, MD   BP 159/60 mmHg  Pulse 58  Temp(Src) 98.4 F (36.9 C) (Oral)  Resp 20  SpO2 94% Physical Exam  Constitutional: She appears well-developed and well-nourished. No distress.  HENT:  Head: Normocephalic and atraumatic.  Mouth/Throat: Oropharynx is clear and moist.  Eyes: Conjunctivae and EOM are normal. Pupils are equal, round, and reactive to light.  Neck: Normal range of motion. Neck supple.  Cardiovascular: Normal rate, regular rhythm and normal heart  sounds.   No murmur heard. Pulmonary/Chest: Effort normal and breath sounds normal. No respiratory distress.  Abdominal: Soft. Bowel sounds are normal. There is no tenderness.  Musculoskeletal: Normal range of motion.  Neurological: She is alert. No cranial nerve deficit. She exhibits normal muscle tone. Coordination normal.  Skin: Skin is warm. No rash noted.  Nursing note and vitals reviewed.   ED Course  Procedures (including critical care time) Labs Review Labs Reviewed  COMPREHENSIVE METABOLIC PANEL - Abnormal; Notable for the following:    Chloride 112 (*)    Glucose, Bld 100 (*)    Creatinine, Ser 1.01 (*)    Calcium 8.4 (*)    Total Protein 6.3 (*)    Albumin 3.2 (*)    ALT 12 (*)    Total Bilirubin 1.5 (*)    GFR calc non  Af Amer 47 (*)    GFR calc Af Amer 54 (*)    All other components within normal limits  URINALYSIS, ROUTINE W REFLEX MICROSCOPIC (NOT AT [facility]) - Abnormal; Notable for the following:    Color, Urine AMBER (*)    APPearance TURBID (*)    Hgb urine dipstick LARGE (*)    Protein, ur 100 (*)    Leukocytes, UA LARGE (*)    All other components within normal limits  URINE MICROSCOPIC-ADD ON - Abnormal; Notable for the following:    Squamous Epithelial / LPF 0-5 (*)    Bacteria, UA MANY (*)    All other components within normal limits  URINE CULTURE  CBC WITH DIFFERENTIAL/PLATELET   Results for orders placed or performed during the hospital encounter of 06/23/16  CBC with Differential/Platelet  Result Value Ref Range   WBC 6.4 4.0 - 10.5 K/uL   RBC 3.96 3.87 - 5.11 MIL/uL   Hemoglobin 12.2 12.0 - 15.0 g/dL   HCT 62.9 52.8 - 41.3 %   MCV 95.7 78.0 - 100.0 fL   MCH 30.8 26.0 - 34.0 pg   MCHC 32.2 30.0 - 36.0 g/dL   RDW 24.4 01.0 - 27.2 %   Platelets 234 150 - 400 K/uL   Neutrophils Relative % 69 %   Neutro Abs 4.4 1.7 - 7.7 K/uL   Lymphocytes Relative 18 %   Lymphs Abs 1.2 0.7 - 4.0 K/uL   Monocytes Relative 10 %   Monocytes Absolute 0.7 0.1 -  1.0 K/uL   Eosinophils Relative 3 %   Eosinophils Absolute 0.2 0.0 - 0.7 K/uL   Basophils Relative 0 %   Basophils Absolute 0.0 0.0 - 0.1 K/uL  Comprehensive metabolic panel  Result Value Ref Range   Sodium 141 135 - 145 mmol/L   Potassium 4.4 3.5 - 5.1 mmol/L   Chloride 112 (H) 101 - 111 mmol/L   CO2 23 22 - 32 mmol/L   Glucose, Bld 100 (H) 65 - 99 mg/dL   BUN 17 6 - 20 mg/dL   Creatinine, Ser 5.36 (H) 0.44 - 1.00 mg/dL   Calcium 8.4 (L) 8.9 - 10.3 mg/dL   Total Protein 6.3 (L) 6.5 - 8.1 g/dL   Albumin 3.2 (L) 3.5 - 5.0 g/dL   AST 33 15 - 41 U/L   ALT 12 (L) 14 - 54 U/L   Alkaline Phosphatase 42 38 - 126 U/L   Total Bilirubin 1.5 (H) 0.3 - 1.2 mg/dL   GFR calc non Af Amer 47 (L) >60 mL/min   GFR calc Af Amer 54 (L) >60 mL/min   Anion gap 6 5 - 15  Urinalysis, Routine w reflex microscopic (not at [facility])  Result Value Ref Range   Color, Urine AMBER (A) YELLOW   APPearance TURBID (A) CLEAR   Specific Gravity, Urine 1.014 1.005 - 1.030   pH 6.0 5.0 - 8.0   Glucose, UA NEGATIVE NEGATIVE mg/dL   Hgb urine dipstick LARGE (A) NEGATIVE   Bilirubin Urine NEGATIVE NEGATIVE   Ketones, ur NEGATIVE NEGATIVE mg/dL   Protein, ur 644 (A) NEGATIVE mg/dL   Nitrite NEGATIVE NEGATIVE   Leukocytes, UA LARGE (A) NEGATIVE  Urine microscopic-add on  Result Value Ref Range   Squamous Epithelial / LPF 0-5 (A) NONE SEEN   WBC, UA TOO NUMEROUS TO COUNT 0 - 5 WBC/hpf   RBC / HPF TOO NUMEROUS TO COUNT 0 - 5 RBC/hpf   Bacteria, UA MANY (A) NONE  SEEN     Imaging Review Dg Chest 2 View  06/23/2016  CLINICAL DATA:  Shortness of breath EXAM: CHEST  2 VIEW COMPARISON:  03/22/2015 FINDINGS: Mild interstitial edema. Small to moderate right and small left pleural effusions. Associated right lower lobe opacity, likely atelectasis. Cardiomegaly. Mild degenerative changes of the visualized thoracolumbar spine. IMPRESSION: Cardiomegaly with mild interstitial edema. Small moderate right and small left pleural  effusions. Electronically Signed   By: Charline Bills M.D.   On: 06/23/2016 16:07   I have personally reviewed and evaluated these images and lab results as part of my medical decision-making.   EKG Interpretation   Date/Time:  Sunday June 23 2016 12:59:31 EDT Ventricular Rate:  58 PR Interval:    QRS Duration: 136 QT Interval:  452 QTC Calculation: 444 R Axis:   88 Text Interpretation:  Atrial fibrillation Right bundle branch block  Nonspecific T abnormalities, lateral leads No significant change since  last tracing Confirmed by Dewel Lotter  MD, Adline Kirshenbaum [phone]) on 06/23/2016 1:30:47  PM      MDM   Final diagnoses:  UTI (lower urinary tract infection)    Workup here today shows evidence of urinary tract infection. Urine culture sent. Patient given 1 g of Rocephin here will be continued on Keflex. No concerns for any of pulmonary problems chest x-ray only showed a small right and left pleural effusion. No evidence of pneumonia. No leukocytosis. No significant electrolyte abnormalities. It is possible that the complaint of the shortness of breath may be related to the pleural effusions but her oxygen saturations here have been 95% or higher on room air. Patient could possibly more comfortable sitting upright. But definitely feel that urinary tract infection is present and needs to be treated.    Vanetta Mulders, MD 06/23/16 1816

## 2016-06-23 NOTE — ED Notes (Signed)
Pt from [facility] via EMS- Per staff, pt has hx of dementia but kept complaining that she was SOB. Staff gave neb today and called EMS. EMS reports that lung sounds are clear and was 94% on RA. Pt at baseline per staff with the exception of asking for help. Pt in NAD

## 2016-06-25 LAB — URINE CULTURE

## 2016-08-12 ENCOUNTER — Emergency Department (HOSPITAL_COMMUNITY)
Admission: EM | Admit: 2016-08-12 | Discharge: 2016-08-13 | Disposition: A | Discharge status: Home / Self Care | Payer: Medicare Other | Attending: Emergency Medicine | Admitting: Emergency Medicine

## 2016-08-12 ENCOUNTER — Emergency Department (HOSPITAL_COMMUNITY): Payer: Medicare Other

## 2016-08-12 ENCOUNTER — Encounter (HOSPITAL_COMMUNITY): Payer: Self-pay | Admitting: Emergency Medicine

## 2016-08-12 DIAGNOSIS — Y939 Activity, unspecified: Secondary | ICD-10-CM | POA: Insufficient documentation

## 2016-08-12 DIAGNOSIS — Z79899 Other long term (current) drug therapy: Secondary | ICD-10-CM | POA: Diagnosis not present

## 2016-08-12 DIAGNOSIS — Z043 Encounter for examination and observation following other accident: Secondary | ICD-10-CM | POA: Diagnosis present

## 2016-08-12 DIAGNOSIS — F0390 Unspecified dementia without behavioral disturbance: Secondary | ICD-10-CM | POA: Diagnosis not present

## 2016-08-12 DIAGNOSIS — I110 Hypertensive heart disease with heart failure: Secondary | ICD-10-CM | POA: Diagnosis not present

## 2016-08-12 DIAGNOSIS — Y92129 Unspecified place in nursing home as the place of occurrence of the external cause: Secondary | ICD-10-CM | POA: Diagnosis not present

## 2016-08-12 DIAGNOSIS — Y999 Unspecified external cause status: Secondary | ICD-10-CM | POA: Insufficient documentation

## 2016-08-12 DIAGNOSIS — I2510 Atherosclerotic heart disease of native coronary artery without angina pectoris: Secondary | ICD-10-CM | POA: Insufficient documentation

## 2016-08-12 DIAGNOSIS — I509 Heart failure, unspecified: Secondary | ICD-10-CM | POA: Insufficient documentation

## 2016-08-12 DIAGNOSIS — W19XXXA Unspecified fall, initial encounter: Secondary | ICD-10-CM | POA: Diagnosis not present

## 2016-08-12 DIAGNOSIS — E119 Type 2 diabetes mellitus without complications: Secondary | ICD-10-CM | POA: Insufficient documentation

## 2016-08-12 NOTE — ED Provider Notes (Signed)
[facility]-EMERGENCY DEPT Provider Note   CSN: [id number] Arrival date & time: 08/12/16  1817     History   Chief Complaint Chief Complaint  Patient presents with  . Fall    HPI Isabella Mccoy is a 82 y.o. female.  HPI Patient presents to the emergency department following a possible fall.  The patient lives in a nursing facility because her.  She fell, the staff was concerned that she may have some tenderness in the emergency department.  The patient cannot give me any history because of dementia and she is nonverbal at this time Past Medical History:  Diagnosis Date  . Atrial fibrillation (HCC)   . CHF (congestive heart failure) (HCC)   . Coronary artery disease   . Dementia   . Diabetes mellitus (HCC)   . GERD (gastroesophageal reflux disease)   . Hypertension     There are no active problems to display for this patient.   History reviewed. No pertinent surgical history.  OB History    No data available       Home Medications    Prior to Admission medications   Medication Sig Start Date End Date Taking? Authorizing Provider  acetaminophen (TYLENOL) 650 MG CR tablet Take 650 mg by mouth 2 (two) times daily. arthritis    Historical Provider, MD  albuterol (PROVENTIL HFA;VENTOLIN HFA) 108 (90 BASE) MCG/ACT inhaler Inhale 2 puffs into the lungs every 6 (six) hours.    Historical Provider, MD  carboxymethylcellulose 1 % ophthalmic solution Place 1 drop into both eyes 4 (four) times daily.    Historical Provider, MD  carvedilol (COREG) 3.125 MG tablet Take 3.125 mg by mouth 2 (two) times daily with a meal.    Historical Provider, MD  cephALEXin (KEFLEX) 500 MG capsule Take 1 capsule (500 mg total) by mouth 4 (four) times daily. Patient not taking: Reported on 03/22/2015 10/15/13   Lorre Nick, MD  cephALEXin (KEFLEX) 500 MG capsule Take 1 capsule (500 mg total) by mouth 4 (four) times daily. 06/23/16   Vanetta Mulders, MD  cholecalciferol (VITAMIN D) 1000 UNITS tablet Take  1,000 Units by mouth daily.    Historical Provider, MD  clotrimazole (LOTRIMIN) 1 % cream Apply to affected area 2 times daily Patient not taking: Reported on 02/22/2016 10/15/13   Lorre Nick, MD  Cranberry 450 MG TABS Take 1 tablet by mouth 2 (two) times daily.    Historical Provider, MD  divalproex (DEPAKOTE SPRINKLE) 125 MG capsule Take 125 mg by mouth 2 (two) times daily.     Historical Provider, MD  DULoxetine (CYMBALTA) 30 MG capsule Take 30 mg by mouth daily.    Historical Provider, MD  furosemide (LASIX) 40 MG tablet Take 40 mg by mouth daily.    Historical Provider, MD  guaifenesin (ROBITUSSIN) 100 MG/5ML syrup Take 600 mg by mouth every 6 (six) hours as needed for cough.     Historical Provider, MD  hydrALAZINE (APRESOLINE) 50 MG tablet Take 50 mg by mouth 2 (two) times daily.     Historical Provider, MD  hydrocerin (EUCERIN) CREA Apply 1 application topically 2 (two) times daily.    Historical Provider, MD  HYDROcodone-acetaminophen (NORCO/VICODIN) 5-325 MG per tablet Take 0.5 tablets by mouth 3 (three) times daily.     Historical Provider, MD  levothyroxine (SYNTHROID, LEVOTHROID) 25 MCG tablet Take 37.5 mcg by mouth daily.     Historical Provider, MD  lisinopril (PRINIVIL,ZESTRIL) 2.5 MG tablet Take 2.5 mg by mouth daily. Take  along with the 5mg  tablet per [facility]    Historical Provider, MD  lisinopril (PRINIVIL,ZESTRIL) 5 MG tablet Take 5 mg by mouth daily. Take along with the 2.5mg     Historical Provider, MD  loperamide (IMODIUM) 2 MG capsule Take 2-4 mg by mouth as needed for diarrhea or loose stools (Take 2 capsules by mouth after First Loose Stool then take 1 capsule by mouth As Needed for each Additional Loose Stool). For diarrhea    Historical Provider, MD  LORazepam (ATIVAN) 0.5 MG tablet Take 0.25 mg by mouth at bedtime.     Historical Provider, MD  Multiple Vitamins-Minerals (CEROVITE PO) Take 1 tablet by mouth daily.    Historical Provider, MD  nystatin cream (MYCOSTATIN) Apply  1 application topically 3 (three) times daily. Apply under the breasts    Historical Provider, MD  potassium chloride (K-DUR) 10 MEQ tablet Take 1 tablet (10 mEq total) by mouth 2 (two) times daily. Patient not taking: Reported on 03/22/2015 10/15/13   Lorre Nick, MD  potassium chloride SA (K-DUR,KLOR-CON) 20 MEQ tablet Take 20 mEq by mouth daily.    Historical Provider, MD  QUEtiapine (SEROQUEL) 25 MG tablet Take 25 mg by mouth at bedtime.    Historical Provider, MD  sennosides-docusate sodium (SENOKOT-S) 8.6-50 MG tablet Take 1 tablet by mouth at bedtime.    Historical Provider, MD  Skin Protectants, Misc. (BAZA PROTECT EX) Apply 1 application topically 3 (three) times daily. On Buttocks.    Historical Provider, MD  traZODone (DESYREL) 50 MG tablet Take 25 mg by mouth at bedtime.     Historical Provider, MD    Family History History reviewed. No pertinent family history.  Social History Social History  Substance Use Topics  . Smoking status: Never Smoker  . Smokeless tobacco: Not on file  . Alcohol use No     Allergies   Morphine and related   Review of Systems Review of Systems  All other systems negative except as documented in the HPI. All pertinent positives and negatives as reviewed in the HPI.  Physical Exam Updated Vital Signs BP 165/65 (BP Location: Left Arm)   Pulse 67   Temp 98.2 F (36.8 C) (Oral)   Resp 14   SpO2 92%   Physical Exam  Constitutional: She is oriented to person, place, and time. She appears well-developed and well-nourished. No distress.  HENT:  Head: Normocephalic and atraumatic.  Mouth/Throat: Oropharynx is clear and moist.  Eyes: Pupils are equal, round, and reactive to light.  Neck: Normal range of motion. Neck supple.  Cardiovascular: Normal rate, regular rhythm and normal heart sounds.  Exam reveals no gallop and no friction rub.   No murmur heard. Pulmonary/Chest: Effort normal and breath sounds normal. No respiratory distress. She  has no wheezes.  Abdominal: Soft. Bowel sounds are normal. She exhibits no distension. There is no tenderness.  Neurological: She is alert and oriented to person, place, and time. She exhibits normal muscle tone. Coordination normal.  Skin: Skin is warm and dry. No rash noted. No erythema.  Psychiatric: She has a normal mood and affect. Her behavior is normal.  Nursing note and vitals reviewed.    ED Treatments / Results  Labs (all labs ordered are listed, but only abnormal results are displayed) Labs Reviewed - No data to display  EKG  EKG Interpretation None       Radiology Ct Head Wo Contrast  Result Date: 08/12/2016 CLINICAL DATA:  82 year old female with history of trauma  from a fall. EXAM: CT HEAD WITHOUT CONTRAST CT CERVICAL SPINE WITHOUT CONTRAST TECHNIQUE: Multidetector CT imaging of the head and cervical spine was performed following the standard protocol without intravenous contrast. Multiplanar CT image reconstructions of the cervical spine were also generated. COMPARISON:  Head CT 03/15/2016. FINDINGS: CT HEAD FINDINGS Moderate cerebral and mild cerebellar atrophy. Patchy and confluent areas of decreased attenuation are noted throughout the deep and periventricular white matter of the cerebral hemispheres bilaterally, compatible with chronic microvascular ischemic disease. Multiple well-defined low-attenuation areas in the cerebellar hemispheres bilaterally, compatible with old lacunar infarcts, similar to the prior examination. No acute displaced skull fractures are identified. No acute intracranial abnormality. Specifically, no evidence of acute post-traumatic intracranial hemorrhage, no definite regions of acute/subacute cerebral ischemia, no focal mass, mass effect, hydrocephalus or abnormal intra or extra-axial fluid collections. The visualized paranasal sinuses and mastoids are well pneumatized. CT CERVICAL SPINE FINDINGS No acute displaced fractures of the cervical spine.  Alignment is anatomic. Prevertebral soft tissues are normal. There is severe multilevel degenerative disc disease, most pronounced at C4-C5, C5-C6 and C6-C7. Severe multilevel facet arthropathy is also noted. Visualized portions of the upper thorax are remarkable for a 7 x 5 mm (mean diameter 6 mm) pleural-based pulmonary nodule in the medial aspect of the apex of the right upper lobe (image 60 of series 14). IMPRESSION: 1. No evidence of significant acute traumatic injury to the skull, brain or cervical spine. 2. 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 3. Multilevel degenerative disc disease and cervical spondylosis, as above. 4. **An incidental finding of potential clinical significance has been found. Pleural-based pulmonary nodule with a mean diameter of 6 mm in the medial aspect of the apex of the right upper lobe. Non-contrast chest CT at 6-12 months is recommended. If the nodule is stable at time of repeat CT, then future CT at 18-24 months (from today's scan) is considered optional for low-risk patients, but is recommended for high-risk patients. This recommendation follows the consensus statement: Guidelines for Management of Incidental Pulmonary Nodules Detected on CT Images:From the Fleischner Society 2017; published online before print ([id number]).** Electronically Signed   By: Trudie Reedaniel  Entrikin M.D.   On: 08/12/2016 20:15   Ct Cervical Spine Wo Contrast  Result Date: 08/12/2016 CLINICAL DATA:  82 year old female with history of trauma from a fall. EXAM: CT HEAD WITHOUT CONTRAST CT CERVICAL SPINE WITHOUT CONTRAST TECHNIQUE: Multidetector CT imaging of the head and cervical spine was performed following the standard protocol without intravenous contrast. Multiplanar CT image reconstructions of the cervical spine were also  generated. COMPARISON:  Head CT 03/15/2016. FINDINGS: CT HEAD FINDINGS Moderate cerebral and mild cerebellar atrophy. Patchy and confluent areas of decreased attenuation are noted throughout the deep and periventricular white matter of the cerebral hemispheres bilaterally, compatible with chronic microvascular ischemic disease. Multiple well-defined low-attenuation areas in the cerebellar hemispheres bilaterally, compatible with old lacunar infarcts, similar to the prior examination. No acute displaced skull fractures are identified. No acute intracranial abnormality. Specifically, no evidence of acute post-traumatic intracranial hemorrhage, no definite regions of acute/subacute cerebral ischemia, no focal mass, mass effect, hydrocephalus or abnormal intra or extra-axial fluid collections. The visualized paranasal sinuses and mastoids are well pneumatized. CT CERVICAL SPINE FINDINGS No acute displaced fractures of the cervical spine. Alignment is anatomic. Prevertebral soft tissues are normal. There is severe multilevel degenerative disc disease, most pronounced at C4-C5,  C5-C6 and C6-C7. Severe multilevel facet arthropathy is also noted. Visualized portions of the upper thorax are remarkable for a 7 x 5 mm (mean diameter 6 mm) pleural-based pulmonary nodule in the medial aspect of the apex of the right upper lobe (image 60 of series 14). IMPRESSION: 1. No evidence of significant acute traumatic injury to the skull, brain or cervical spine. 2. 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 3. Multilevel degenerative disc disease and cervical spondylosis, as above. 4. **An incidental finding of potential clinical significance has been found. Pleural-based pulmonary nodule with a mean diameter of 6 mm in the medial aspect of the apex of the right upper lobe.  Non-contrast chest CT at 6-12 months is recommended. If the nodule is stable at time of repeat CT, then future CT at 18-24 months (from today's scan) is considered optional for low-risk patients, but is recommended for high-risk patients. This recommendation follows the consensus statement: Guidelines for Management of Incidental Pulmonary Nodules Detected on CT Images:From the Fleischner Society 2017; published online before print (10.1148/[id number]).** Electronically Signed   By: Trudie Reed M.D.   On: 08/12/2016 20:15    Procedures Procedures (including critical care time)  Medications Ordered in ED Medications - No data to display   Initial Impression / Assessment and Plan / ED Course  I have reviewed the triage vital signs and the nursing notes.  Pertinent labs & imaging results that were available during my care of the patient were reviewed by me and considered in my medical decision making (see chart for details).  Clinical Course    Patient will be discharged back to the facility told to return here for any worsening in her condition.  Facility made aware of the need for follow-up with her primary doctor  Final Clinical Impressions(s) / ED Diagnoses   Final diagnoses:  None    New Prescriptions New Prescriptions   No medications on file     Charlestine Night, PA-C 08/12/16 2121    Lavera Guise, MD 08/13/16 0330

## 2016-08-12 NOTE — ED Notes (Signed)
Waiting for [facility] to transport back to facility  Pt resting in bed with eyes closed  No acute distress noted

## 2016-08-12 NOTE — ED Notes (Signed)
Bed: [facility] Expected date:  Expected time:  Means of arrival:  Comments: 

## 2016-08-12 NOTE — ED Notes (Signed)
[facility]

## 2016-08-12 NOTE — ED Notes (Signed)
[facility] notified of need for transport. 

## 2016-08-12 NOTE — ED Triage Notes (Signed)
Per EMS. Pt from [facility] Hx of dementia, oriented per norm. Pt sent to ED for evaluation for unwitnessed fall. Pt found sitting on the floor at the end of her bed. No obvious injury, pt denies pain. Pt has had 3 other falls this week which were witnessed where she slowly slid to the floor.

## 2016-08-12 NOTE — Discharge Instructions (Signed)
Return here as needed. °

## 2016-10-23 ENCOUNTER — Emergency Department (HOSPITAL_COMMUNITY)
Admission: EM | Admit: 2016-10-23 | Discharge: 2016-10-23 | Disposition: A | Discharge status: Home / Self Care | Payer: Medicare Other | Attending: Emergency Medicine | Admitting: Emergency Medicine

## 2016-10-23 ENCOUNTER — Emergency Department (HOSPITAL_COMMUNITY): Payer: Medicare Other

## 2016-10-23 DIAGNOSIS — Z79899 Other long term (current) drug therapy: Secondary | ICD-10-CM | POA: Diagnosis not present

## 2016-10-23 DIAGNOSIS — Y939 Activity, unspecified: Secondary | ICD-10-CM | POA: Diagnosis not present

## 2016-10-23 DIAGNOSIS — I509 Heart failure, unspecified: Secondary | ICD-10-CM | POA: Diagnosis not present

## 2016-10-23 DIAGNOSIS — W19XXXA Unspecified fall, initial encounter: Secondary | ICD-10-CM | POA: Insufficient documentation

## 2016-10-23 DIAGNOSIS — Y929 Unspecified place or not applicable: Secondary | ICD-10-CM | POA: Insufficient documentation

## 2016-10-23 DIAGNOSIS — I2510 Atherosclerotic heart disease of native coronary artery without angina pectoris: Secondary | ICD-10-CM | POA: Diagnosis not present

## 2016-10-23 DIAGNOSIS — Y999 Unspecified external cause status: Secondary | ICD-10-CM | POA: Diagnosis not present

## 2016-10-23 DIAGNOSIS — S0101XA Laceration without foreign body of scalp, initial encounter: Secondary | ICD-10-CM | POA: Insufficient documentation

## 2016-10-23 DIAGNOSIS — I110 Hypertensive heart disease with heart failure: Secondary | ICD-10-CM | POA: Insufficient documentation

## 2016-10-23 DIAGNOSIS — E119 Type 2 diabetes mellitus without complications: Secondary | ICD-10-CM | POA: Diagnosis not present

## 2016-10-23 DIAGNOSIS — R911 Solitary pulmonary nodule: Secondary | ICD-10-CM | POA: Insufficient documentation

## 2016-10-23 DIAGNOSIS — E041 Nontoxic single thyroid nodule: Secondary | ICD-10-CM | POA: Insufficient documentation

## 2016-10-23 NOTE — ED Provider Notes (Signed)
[facility]-EMERGENCY DEPT Provider Note   CSN: [id number] Arrival date & time: 10/23/16  1445     History   Chief Complaint Chief Complaint  Patient presents with  . Fall    HPI Isabella Mccoy is a 82 y.o. female.  Patient is a 82 year old female with a history of atrial fibrillation, coronary artery disease, dementia, diabetes, hypertension who currently is not on anticoagulation presenting from the facility today after a fall which is most likely mechanical. Patient was in bed and an hour later when someone walked by her roommate saw her sitting on the floor. She had a laceration to the back of her head and stated her head hurt but otherwise had no further complaints. EMS verified that patient was at her baseline mental status. She is able to move all extremities without difficulty. She occasionally answers questions.   The history is provided by the EMS personnel and the nursing home. The history is limited by the absence of a caregiver.  Fall  This is a recurrent problem. The current episode started 1 to 2 hours ago. The problem occurs constantly. The problem has not changed since onset.Associated symptoms comments: Head pain and scalp laceration. Nothing aggravates the symptoms. Nothing relieves the symptoms. She has tried nothing for the symptoms.    Past Medical History:  Diagnosis Date  . Atrial fibrillation (HCC)   . CHF (congestive heart failure) (HCC)   . Coronary artery disease   . Dementia   . Diabetes mellitus (HCC)   . GERD (gastroesophageal reflux disease)   . Hypertension     There are no active problems to display for this patient.   No past surgical history on file.  OB History    No data available       Home Medications    Prior to Admission medications   Medication Sig Start Date End Date Taking? Authorizing Provider  acetaminophen (TYLENOL) 650 MG CR tablet Take 650 mg by mouth 2 (two) times daily. arthritis    Historical Provider, MD  albuterol  (PROVENTIL HFA;VENTOLIN HFA) 108 (90 BASE) MCG/ACT inhaler Inhale 2 puffs into the lungs every 6 (six) hours.    Historical Provider, MD  carboxymethylcellulose 1 % ophthalmic solution Place 1 drop into both eyes 4 (four) times daily.    Historical Provider, MD  carvedilol (COREG) 3.125 MG tablet Take 3.125 mg by mouth 2 (two) times daily with a meal.    Historical Provider, MD  cephALEXin (KEFLEX) 500 MG capsule Take 1 capsule (500 mg total) by mouth 4 (four) times daily. Patient not taking: Reported on 03/22/2015 10/15/13   Lorre NickAnthony Allen, MD  cephALEXin (KEFLEX) 500 MG capsule Take 1 capsule (500 mg total) by mouth 4 (four) times daily. 06/23/16   Vanetta MuldersScott Zackowski, MD  cholecalciferol (VITAMIN D) 1000 UNITS tablet Take 1,000 Units by mouth daily.    Historical Provider, MD  clotrimazole (LOTRIMIN) 1 % cream Apply to affected area 2 times daily Patient not taking: Reported on 02/22/2016 10/15/13   Lorre NickAnthony Allen, MD  Cranberry 450 MG TABS Take 1 tablet by mouth 2 (two) times daily.    Historical Provider, MD  divalproex (DEPAKOTE SPRINKLE) 125 MG capsule Take 125 mg by mouth 2 (two) times daily.     Historical Provider, MD  DULoxetine (CYMBALTA) 30 MG capsule Take 30 mg by mouth daily.    Historical Provider, MD  furosemide (LASIX) 40 MG tablet Take 40 mg by mouth daily.    Historical Provider, MD  guaifenesin (ROBITUSSIN) 100 MG/5ML syrup Take 600 mg by mouth every 6 (six) hours as needed for cough.     Historical Provider, MD  hydrALAZINE (APRESOLINE) 50 MG tablet Take 50 mg by mouth 2 (two) times daily.     Historical Provider, MD  hydrocerin (EUCERIN) CREA Apply 1 application topically 2 (two) times daily.    Historical Provider, MD  HYDROcodone-acetaminophen (NORCO/VICODIN) 5-325 MG per tablet Take 0.5 tablets by mouth 3 (three) times daily.     Historical Provider, MD  levothyroxine (SYNTHROID, LEVOTHROID) 25 MCG tablet Take 37.5 mcg by mouth daily.     Historical Provider, MD  lisinopril  (PRINIVIL,ZESTRIL) 2.5 MG tablet Take 2.5 mg by mouth daily. Take along with the 5mg  tablet per [facility]    Historical Provider, MD  lisinopril (PRINIVIL,ZESTRIL) 5 MG tablet Take 5 mg by mouth daily. Take along with the 2.5mg     Historical Provider, MD  loperamide (IMODIUM) 2 MG capsule Take 2-4 mg by mouth as needed for diarrhea or loose stools (Take 2 capsules by mouth after First Loose Stool then take 1 capsule by mouth As Needed for each Additional Loose Stool). For diarrhea    Historical Provider, MD  LORazepam (ATIVAN) 0.5 MG tablet Take 0.25 mg by mouth at bedtime.     Historical Provider, MD  Multiple Vitamins-Minerals (CEROVITE PO) Take 1 tablet by mouth daily.    Historical Provider, MD  nystatin cream (MYCOSTATIN) Apply 1 application topically 3 (three) times daily. Apply under the breasts    Historical Provider, MD  potassium chloride (K-DUR) 10 MEQ tablet Take 1 tablet (10 mEq total) by mouth 2 (two) times daily. Patient not taking: Reported on 03/22/2015 10/15/13   Lorre Nick, MD  potassium chloride SA (K-DUR,KLOR-CON) 20 MEQ tablet Take 20 mEq by mouth daily.    Historical Provider, MD  QUEtiapine (SEROQUEL) 25 MG tablet Take 25 mg by mouth at bedtime.    Historical Provider, MD  sennosides-docusate sodium (SENOKOT-S) 8.6-50 MG tablet Take 1 tablet by mouth at bedtime.    Historical Provider, MD  Skin Protectants, Misc. (BAZA PROTECT EX) Apply 1 application topically 3 (three) times daily. On Buttocks.    Historical Provider, MD  traZODone (DESYREL) 50 MG tablet Take 25 mg by mouth at bedtime.     Historical Provider, MD    Family History No family history on file.  Social History Social History  Substance Use Topics  . Smoking status: Never Smoker  . Smokeless tobacco: Not on file  . Alcohol use No     Allergies   Morphine and related   Review of Systems Review of Systems  Unable to perform ROS: Dementia     Physical Exam Updated Vital Signs BP 165/77 (BP Location:  Right Arm)   Pulse 63   Temp 98.7 F (37.1 C) (Oral) Comment (Src): Btw Gum and Cheek  Resp 18   SpO2 95%   Physical Exam  Constitutional: She appears well-developed and well-nourished. No distress.  HENT:  Head: Normocephalic. Head is with laceration.    Mouth/Throat: Oropharynx is clear and moist.  Eyes: Conjunctivae and EOM are normal. Pupils are equal, round, and reactive to light.  Neck: Normal range of motion. Neck supple.  Cardiovascular: Normal rate, regular rhythm and intact distal pulses.   No murmur heard. Pulmonary/Chest: Effort normal and breath sounds normal. No respiratory distress. She has no wheezes. She has no rales.  Abdominal: Soft. She exhibits no distension. There is no tenderness. There is  no rebound and no guarding.  Musculoskeletal: Normal range of motion. She exhibits no edema or tenderness.  Able to range all ext without difficulty or pain  Neurological: She is alert. She has normal strength. No sensory deficit.  Patient does not answer questions but will occasionally state her head burns  Skin: Skin is warm and dry. No rash noted. No erythema.  Psychiatric: Her behavior is normal.  Nursing note and vitals reviewed.    ED Treatments / Results  Labs (all labs ordered are listed, but only abnormal results are displayed) Labs Reviewed - No data to display  EKG  EKG Interpretation None       Radiology Ct Head Wo Contrast  Result Date: 10/23/2016 CLINICAL DATA:  Pain following unwitnessed fall. History of dementia EXAM: CT HEAD WITHOUT CONTRAST CT CERVICAL SPINE WITHOUT CONTRAST TECHNIQUE: Multidetector CT imaging of the head and cervical spine was performed following the standard protocol without intravenous contrast. Multiplanar CT image reconstructions of the cervical spine were also generated. COMPARISON:  CT head and CT cervical spine August 12, 2016 FINDINGS: CT HEAD FINDINGS Brain: Moderate diffuse atrophy is stable. There is no intracranial  mass, hemorrhage, extra-axial fluid collection, or midline shift. There is small vessel disease throughout the centra semiovale bilaterally. There is a prior small infarct in the posterior mid right cerebellum, stable. There are scattered rather tiny lacunar infarcts in each cerebellar hemisphere as well. There is no new gray-white compartment lesion. No acute infarct evident. Vascular: There is no hyperdense vessel. There is calcification in each carotid siphon region. There is also calcification in each distal vertebral artery. Skull: The bony calvarium appears intact. Sinuses/Orbits: Visualized paranasal sinuses are clear. Visualized orbits appear symmetric bilaterally. Other: Mastoid air cells are clear. CT CERVICAL SPINE FINDINGS Alignment: There is stable 3 mm of anterolisthesis of C5 on C6. There is stable 2 mm of anterolisthesis of C6 on C7. No new spondylolisthesis is evident. There is cervical levoscoliosis. Skull base and vertebrae: The craniocervical junction and skull base regions appear normal. There is no demonstrable fracture. There are no blastic or lytic bone lesions. Soft tissues and spinal canal: Prevertebral soft tissues and predental space regions are normal. There are no paraspinous lesions. There is no appreciable spinal stenosis. Disc levels: There is severe disc space narrowing at C5-6 and C6-7. There is moderate disc space narrowing at C4-5. There is also moderately severe disc space narrowing at T1-2. There is multilevel facet hypertrophy. There is exit foraminal narrowing due to facet hypertrophy at C3-4 bilaterally, C4-5 bilaterally, C5-6 on the right, and C6-7 bilaterally without appreciable nerve root edema or effacement. No disc extrusion is evident. Upper chest: There is a 6 x 6 mm nodular opacity which appears to arise from the posterior pleural region on the right posteriorly seen on axial slice 4 series 10. Visualized lungs otherwise appear unremarkable. There is atherosclerotic  calcification in the aorta. Other: There is a mass arising from the right lobe of the thyroid measuring 2.2 x 1.8 cm with mild deviation of the trachea in the upper thoracic region toward the left. There are foci of calcification in each carotid artery. IMPRESSION: CT head: Atrophy with periventricular small vessel disease, stable. Prior posterior midportion right cerebellar infarct, stable. There are several tiny scattered lacunar infarcts in the cerebellum as well, stable. No intracranial mass, hemorrhage, or extra-axial fluid collection. No acute infarct evident. There are multiple foci of arterial vascular calcification. CT cervical spine: No fracture. Areas of spondylolisthesis are  stable and felt to be due to underlying spondylosis. There is multilevel osteoarthritic change. **An incidental finding of potential clinical significance has been found. There is a the right sided thyroid nodular lesion measuring 2.2 x 1.8 cm which deviates the trachea. Consider further evaluation with thyroid ultrasound. If patient is clinically hyperthyroid, consider nuclear medicine thyroid uptake and scan.** **An incidental finding of potential clinical significance has been found. 6 mm nodular opacity in the region of the posterior right pleura of the upper hemithorax. Non-contrast chest CT at 6-12 months is recommended. If the nodule is stable at time of repeat CT, then future CT at 18-24 months (from today's scan) is considered optional for low-risk patients, but is recommended for high-risk patients. This recommendation follows the consensus statement: Guidelines for Management of Incidental Pulmonary Nodules Detected on CT Images: From the Fleischner Society 2017; Radiology 2017; 284:228-243.** There is bilateral carotid artery calcification. Electronically Signed   By: Bretta Bang III M.D.   On: 10/23/2016 15:43   Ct Cervical Spine Wo Contrast  Result Date: 10/23/2016 CLINICAL DATA:  Pain following unwitnessed  fall. History of dementia EXAM: CT HEAD WITHOUT CONTRAST CT CERVICAL SPINE WITHOUT CONTRAST TECHNIQUE: Multidetector CT imaging of the head and cervical spine was performed following the standard protocol without intravenous contrast. Multiplanar CT image reconstructions of the cervical spine were also generated. COMPARISON:  CT head and CT cervical spine August 12, 2016 FINDINGS: CT HEAD FINDINGS Brain: Moderate diffuse atrophy is stable. There is no intracranial mass, hemorrhage, extra-axial fluid collection, or midline shift. There is small vessel disease throughout the centra semiovale bilaterally. There is a prior small infarct in the posterior mid right cerebellum, stable. There are scattered rather tiny lacunar infarcts in each cerebellar hemisphere as well. There is no new gray-white compartment lesion. No acute infarct evident. Vascular: There is no hyperdense vessel. There is calcification in each carotid siphon region. There is also calcification in each distal vertebral artery. Skull: The bony calvarium appears intact. Sinuses/Orbits: Visualized paranasal sinuses are clear. Visualized orbits appear symmetric bilaterally. Other: Mastoid air cells are clear. CT CERVICAL SPINE FINDINGS Alignment: There is stable 3 mm of anterolisthesis of C5 on C6. There is stable 2 mm of anterolisthesis of C6 on C7. No new spondylolisthesis is evident. There is cervical levoscoliosis. Skull base and vertebrae: The craniocervical junction and skull base regions appear normal. There is no demonstrable fracture. There are no blastic or lytic bone lesions. Soft tissues and spinal canal: Prevertebral soft tissues and predental space regions are normal. There are no paraspinous lesions. There is no appreciable spinal stenosis. Disc levels: There is severe disc space narrowing at C5-6 and C6-7. There is moderate disc space narrowing at C4-5. There is also moderately severe disc space narrowing at T1-2. There is multilevel facet  hypertrophy. There is exit foraminal narrowing due to facet hypertrophy at C3-4 bilaterally, C4-5 bilaterally, C5-6 on the right, and C6-7 bilaterally without appreciable nerve root edema or effacement. No disc extrusion is evident. Upper chest: There is a 6 x 6 mm nodular opacity which appears to arise from the posterior pleural region on the right posteriorly seen on axial slice 4 series 10. Visualized lungs otherwise appear unremarkable. There is atherosclerotic calcification in the aorta. Other: There is a mass arising from the right lobe of the thyroid measuring 2.2 x 1.8 cm with mild deviation of the trachea in the upper thoracic region toward the left. There are foci of calcification in each carotid artery. IMPRESSION:  CT head: Atrophy with periventricular small vessel disease, stable. Prior posterior midportion right cerebellar infarct, stable. There are several tiny scattered lacunar infarcts in the cerebellum as well, stable. No intracranial mass, hemorrhage, or extra-axial fluid collection. No acute infarct evident. There are multiple foci of arterial vascular calcification. CT cervical spine: No fracture. Areas of spondylolisthesis are stable and felt to be due to underlying spondylosis. There is multilevel osteoarthritic change. **An incidental finding of potential clinical significance has been found. There is a the right sided thyroid nodular lesion measuring 2.2 x 1.8 cm which deviates the trachea. Consider further evaluation with thyroid ultrasound. If patient is clinically hyperthyroid, consider nuclear medicine thyroid uptake and scan.** **An incidental finding of potential clinical significance has been found. 6 mm nodular opacity in the region of the posterior right pleura of the upper hemithorax. Non-contrast chest CT at 6-12 months is recommended. If the nodule is stable at time of repeat CT, then future CT at 18-24 months (from today's scan) is considered optional for low-risk patients, but  is recommended for high-risk patients. This recommendation follows the consensus statement: Guidelines for Management of Incidental Pulmonary Nodules Detected on CT Images: From the Fleischner Society 2017; Radiology 2017; 284:228-243.** There is bilateral carotid artery calcification. Electronically Signed   By: Bretta Bang III M.D.   On: 10/23/2016 15:43    Procedures Procedures (including critical care time)  Medications Ordered in ED Medications - No data to display   Initial Impression / Assessment and Plan / ED Course  I have reviewed the triage vital signs and the nursing notes.  Pertinent labs & imaging results that were available during my care of the patient were reviewed by me and considered in my medical decision making (see chart for details).  Clinical Course    Patient is a 82 year old female presenting from the facility today after a fall which is most likely mechanical but not witness. Patient was awake the entire time staff was with her and before when she was in her bed. She was found sitting next to her bed with a laceration to the back of her head. Patient does have a history of recurrent falls. She is not on any cancer quite relation at this time. Staff confirm that she is at her baseline mental status. Patient only complains of some burning of her head. Wound repaired with Dermabond and tetanus shot is up-to-date. CT of head and neck pending.  3:50 PM CT negative for acute bleed or acute fracture. 2 incidental findings one of the thyroid nodule that needs follow-up with ultrasound and secondly on the lung nodule that need CT in the next 6-12 months. This will be communicated with her living facility.  LACERATION REPAIR Performed by: Gwyneth Sprout Authorized byGwyneth Sprout Consent: Verbal consent obtained. Risks and benefits: risks, benefits and alternatives were discussed Consent given by: patient Patient identity confirmed: provided demographic  data Prepped and Draped in normal sterile fashion Wound explored  Laceration Location: occipital scalp  Laceration Length: 2cm  No Foreign Bodies seen or palpated  Anesthesia: none Irrigation method: scrub Amount of cleaning: standard  Skin closure: dermabond   Patient tolerance: Patient tolerated the procedure well with no immediate complications.    Final Clinical Impressions(s) / ED Diagnoses   Final diagnoses:  Laceration of scalp, initial encounter  Thyroid nodule  Lung nodule    New Prescriptions New Prescriptions   No medications on file     Gwyneth Sprout, MD 10/23/16 1553

## 2016-10-23 NOTE — ED Notes (Signed)
Bed: WA07 Expected date:  Expected time:  Means of arrival:  Comments: Elderly fall

## 2016-10-23 NOTE — ED Triage Notes (Addendum)
[facility] Nursing Facility, pt was found on ground with laceration to scalp. Pt c/o head pain and buttocks pain. Pt denied neck pain upon palpation by EMS. Pt has hx of dementia.   EMS Vitals BP 160/72 HR 68 SPO2 96 RR 18 CBG 143

## 2018-06-15 ENCOUNTER — Encounter (HOSPITAL_COMMUNITY): Payer: Self-pay | Admitting: Emergency Medicine

## 2018-06-15 ENCOUNTER — Emergency Department (HOSPITAL_COMMUNITY)
Admission: EM | Admit: 2018-06-15 | Discharge: 2018-06-15 | Disposition: A | Discharge status: Home / Self Care | Payer: Medicare Other | Attending: Emergency Medicine | Admitting: Emergency Medicine

## 2018-06-15 ENCOUNTER — Other Ambulatory Visit: Payer: Self-pay

## 2018-06-15 DIAGNOSIS — E119 Type 2 diabetes mellitus without complications: Secondary | ICD-10-CM | POA: Insufficient documentation

## 2018-06-15 DIAGNOSIS — I2510 Atherosclerotic heart disease of native coronary artery without angina pectoris: Secondary | ICD-10-CM | POA: Insufficient documentation

## 2018-06-15 DIAGNOSIS — Y998 Other external cause status: Secondary | ICD-10-CM | POA: Insufficient documentation

## 2018-06-15 DIAGNOSIS — S81812A Laceration without foreign body, left lower leg, initial encounter: Secondary | ICD-10-CM | POA: Diagnosis present

## 2018-06-15 DIAGNOSIS — X58XXXA Exposure to other specified factors, initial encounter: Secondary | ICD-10-CM | POA: Diagnosis not present

## 2018-06-15 DIAGNOSIS — Z23 Encounter for immunization: Secondary | ICD-10-CM | POA: Insufficient documentation

## 2018-06-15 DIAGNOSIS — Z79899 Other long term (current) drug therapy: Secondary | ICD-10-CM | POA: Diagnosis not present

## 2018-06-15 DIAGNOSIS — I110 Hypertensive heart disease with heart failure: Secondary | ICD-10-CM | POA: Insufficient documentation

## 2018-06-15 DIAGNOSIS — Y939 Activity, unspecified: Secondary | ICD-10-CM | POA: Insufficient documentation

## 2018-06-15 DIAGNOSIS — I509 Heart failure, unspecified: Secondary | ICD-10-CM | POA: Diagnosis not present

## 2018-06-15 DIAGNOSIS — Y92129 Unspecified place in nursing home as the place of occurrence of the external cause: Secondary | ICD-10-CM | POA: Insufficient documentation

## 2018-06-15 DIAGNOSIS — F0390 Unspecified dementia without behavioral disturbance: Secondary | ICD-10-CM | POA: Diagnosis not present

## 2018-06-15 MED ORDER — LIDOCAINE-EPINEPHRINE (PF) 2 %-1:200000 IJ SOLN
20.0000 mL | Freq: Once | INTRAMUSCULAR | Status: AC
  Administered 2018-06-15: 20 mL via INTRADERMAL
  Filled 2018-06-15: qty 20

## 2018-06-15 MED ORDER — TETANUS-DIPHTH-ACELL PERTUSSIS 5-2.5-18.5 LF-MCG/0.5 IM SUSP
0.5000 mL | Freq: Once | INTRAMUSCULAR | Status: AC
  Administered 2018-06-15: 0.5 mL via INTRAMUSCULAR
  Filled 2018-06-15: qty 0.5

## 2018-06-15 NOTE — ED Notes (Signed)
Gave report to Clabe Sealashira , [facility]

## 2018-06-15 NOTE — ED Provider Notes (Signed)
[facility] EMERGENCY DEPARTMENT Provider Note   CSN: [id number] Arrival date & time: 06/15/18  0617     History   Chief Complaint Chief Complaint  Patient presents with  . Extremity Laceration    HPI Isabella Mccoy is a 84 y.o. female who presents with a L leg laceration. PMH for A.fib, CHF, advanced dementia, CAD. She is from [facility]. Nursing staff noted a new large laceration on her left lower leg when they were changing her clothes. Bleeding is controlled. She did not have a fall. Last tetanus is unknown. She is not on blood thinners.  LEVEL 5 CAVEAT due to dementia.  HPI  Past Medical History:  Diagnosis Date  . Atrial fibrillation (HCC)   . CHF (congestive heart failure) (HCC)   . Coronary artery disease   . Dementia   . Diabetes mellitus (HCC)   . GERD (gastroesophageal reflux disease)   . Hypertension     There are no active problems to display for this patient.   History reviewed. No pertinent surgical history.   OB History   None      Home Medications    Prior to Admission medications   Medication Sig Start Date End Date Taking? Authorizing Provider  carboxymethylcellulose 1 % ophthalmic solution Place 1 drop into both eyes 4 (four) times daily.    [provider]  carvedilol (COREG) 3.125 MG tablet Take 1.5625 mg by mouth 2 (two) times daily with a meal.     [provider]  cephALEXin (KEFLEX) 500 MG capsule Take 1 capsule (500 mg total) by mouth 4 (four) times daily. Patient not taking: Reported on 10/23/2016 06/23/16   Vanetta Mulders, MD  cholecalciferol (VITAMIN D) 1000 UNITS tablet Take 1,000 Units by mouth daily.    [provider]  clotrimazole (LOTRIMIN) 1 % cream Apply to affected area 2 times daily Patient not taking: Reported on 10/23/2016 10/15/13   Lorre Nick, MD  Cranberry 450 MG TABS Take 1 tablet by mouth 2 (two) times daily.    [provider]  divalproex (DEPAKOTE  SPRINKLE) 125 MG capsule Take 125 mg by mouth 2 (two) times daily.     [provider]  DULoxetine (CYMBALTA) 30 MG capsule Take 30 mg by mouth daily.    [provider]  fentaNYL (DURAGESIC - DOSED MCG/HR) 25 MCG/HR patch Place 25 mcg onto the skin every 3 (three) days. 09/06/16   [provider]  furosemide (LASIX) 40 MG tablet Take 40 mg by mouth daily.    [provider]  guaifenesin (ROBITUSSIN) 100 MG/5ML syrup Take 300 mg by mouth every 6 (six) hours as needed for cough.     [provider]  hydrALAZINE (APRESOLINE) 50 MG tablet Take 50 mg by mouth 2 (two) times daily.     [provider]  hydrocerin (EUCERIN) CREA Apply 1 application topically 2 (two) times daily.    [provider]  HYDROcodone-acetaminophen (NORCO/VICODIN) 5-325 MG per tablet Take 1 tablet by mouth 3 (three) times daily as needed (pain).     [provider]  levothyroxine (SYNTHROID, LEVOTHROID) 25 MCG tablet Take 37.5 mcg by mouth daily.     [provider]  loperamide (IMODIUM) 2 MG capsule Take 4 mg by mouth as needed for diarrhea or loose stools (Take 2 capsules by mouth after First Loose Stool then take 1 capsule by mouth As Needed for each Additional Loose Stool). For diarrhea    [provider]  LORazepam (ATIVAN) 0.5 MG tablet Take 0.25-0.5 mg by mouth 2 (two) times daily. Takes 0.25mg  in the AM and 0.5mg  in the PM.    [provider]  Multiple Vitamins-Minerals (CEROVITE PO) Take 1 tablet by mouth daily.    [provider]  nystatin cream (MYCOSTATIN) Apply 1 application topically 3 (three) times daily. Apply under the breasts    [provider]  potassium chloride (K-DUR) 10 MEQ tablet Take 1 tablet (10 mEq total) by mouth 2 (two) times daily. Patient taking differently: Take 30 mEq by mouth daily.  10/15/13   Lorre NickAllen, Anthony, MD  psyllium (REGULOID) 0.52 g capsule Take 0.52 g by mouth at bedtime.     [provider]  QUEtiapine (SEROQUEL) 25 MG tablet Take 25-75 mg by mouth 2 (two) times daily. Takes 25mg  in the AM and 75mg  in the PM.    [provider]  sennosides-docusate sodium (SENOKOT-S) 8.6-50 MG tablet Take 1 tablet by mouth at bedtime.    [provider]  Skin Protectants, Misc. (BAZA PROTECT EX) Apply 1 application topically 3 (three) times daily. On Buttocks.    [provider]  traZODone (DESYREL) 50 MG tablet Take 25 mg by mouth at bedtime.     [provider]    Family History No family history on file.  Social History Social History   Tobacco Use  . Smoking status: Never Smoker  . Smokeless tobacco: Never Used  Substance Use Topics  . Alcohol use: No  . Drug use: No     Allergies   Morphine and related   Review of Systems Review of Systems  Unable to perform ROS: Dementia     Physical Exam Updated Vital Signs BP (!) 136/42   Pulse 61   Temp 98.3 F (36.8 C) (Oral)   Resp 18   SpO2 97%   Physical Exam  Constitutional: She appears well-developed and well-nourished. No distress.  Elderly female in NAD. Responds to voice  HENT:  Head: Normocephalic and atraumatic.  Eyes: Pupils are equal, round, and reactive to light. Conjunctivae are normal. Right eye exhibits no discharge. Left eye exhibits no discharge. No scleral icterus.  Neck: Normal range of motion.  Cardiovascular: Normal rate.  Pulmonary/Chest: Effort normal. No respiratory distress.  Abdominal: She exhibits no distension.  Neurological: She is alert. She is disoriented.  Skin: Skin is warm and dry.  5cm laceration over the left lateral leg. Bleeding is controlled  Psychiatric: She has a normal mood and affect. Her behavior is normal. She is noncommunicative.  Nursing note and vitals reviewed.    ED Treatments / Results  Labs (all labs ordered are listed, but only abnormal results are displayed) Labs Reviewed - No data to  display  EKG None  Radiology No results found.  Procedures .Marland Kitchen.Laceration Repair Date/Time: 06/15/2018 8:49 AM Performed by: Bethel BornGekas, Jim Lundin Marie, PA-C Authorized by: Bethel BornGekas, Mikhail Hallenbeck Marie, PA-C   Consent:    Consent obtained:  Verbal   Consent given by:  Patient   Risks discussed:  Infection and pain   Alternatives discussed:  No treatment Anesthesia (see MAR for exact dosages):    Anesthesia method:  Local infiltration   Local anesthetic:  Lidocaine 2% WITH epi Laceration details:    Location:  Leg   Leg location:  L lower leg   Length (cm):  5   Depth (mm):  5 Repair type:    Repair type:  Simple Pre-procedure details:  Preparation:  Patient was prepped and draped in usual sterile fashion Exploration:    Wound exploration: wound explored through full range of motion and entire depth of wound probed and visualized     Wound extent: no foreign bodies/material noted and no vascular damage noted     Contaminated: no   Treatment:    Area cleansed with:  Shur-Clens   Amount of cleaning:  Standard   Irrigation method:  Tap   Visualized foreign bodies/material removed: no   Skin repair:    Repair method:  Sutures   Suture size:  5-0   Suture material:  Prolene   Number of sutures:  9 Approximation:    Approximation:  Close Post-procedure details:    Dressing:  Non-adherent dressing   Patient tolerance of procedure:  Tolerated well, no immediate complications   (including critical care time)    Medications Ordered in ED Medications  lidocaine-EPINEPHrine (XYLOCAINE W/EPI) 2 %-1:200000 (PF) injection 20 mL (20 mLs Intradermal Given 06/15/18 0741)  Tdap (BOOSTRIX) injection 0.5 mL (0.5 mLs Intramuscular Given 06/15/18 0740)     Initial Impression / Assessment and Plan / ED Course  I have reviewed the triage vital signs and the nursing notes.  Pertinent labs & imaging results that were available during my care of the patient were reviewed by me and considered in my  medical decision making (see chart for details).  84 year old with left leg laceration. It was repaired and irrigated in the ED. Bottom of the wound visualized and bleeding controlled. 9 sutures placed. No other obvious injuries on exam. Tdap was updated. She was discharged back to SNF  Final Clinical Impressions(s) / ED Diagnoses   Final diagnoses:  Laceration of left lower extremity, initial encounter    ED Discharge Orders    None       Bethel Born, PA-C 06/15/18 [id number]    Tegeler, Canary Brim, MD 06/17/18 1150

## 2018-06-15 NOTE — ED Notes (Signed)
Applied non adhesive pad to laceration and wrapped with kerlex

## 2018-06-15 NOTE — ED Notes (Signed)
Gave reprot to [facility] LPT[facility]

## 2018-06-15 NOTE — ED Triage Notes (Signed)
Per EMS pt is from [facility]. Alert to self only at baseline. Staff was changing her clothes this morning and noticed blood on her leg. Pt has an approximately 4-5 inch laceration to left lower leg. Bleeding controlled at this time. Pt is not on blood thinners.

## 2018-06-15 NOTE — Discharge Instructions (Signed)
Please have stitches removed in 7-10 days There are 9 total

## 2018-10-16 DEATH — deceased
